# Patient Record
Sex: Female | Born: 1965
Health system: Southern US, Community
[De-identification: ages and names within clinical notes are randomized; demographics above are authoritative.]

## PROBLEM LIST (undated history)

## (undated) DIAGNOSIS — Z87442 Personal history of urinary calculi: Secondary | ICD-10-CM

## (undated) DIAGNOSIS — Z8659 Personal history of other mental and behavioral disorders: Secondary | ICD-10-CM

## (undated) DIAGNOSIS — M1990 Unspecified osteoarthritis, unspecified site: Secondary | ICD-10-CM

## (undated) DIAGNOSIS — F99 Mental disorder, not otherwise specified: Secondary | ICD-10-CM

## (undated) DIAGNOSIS — F419 Anxiety disorder, unspecified: Secondary | ICD-10-CM

## (undated) DIAGNOSIS — K219 Gastro-esophageal reflux disease without esophagitis: Secondary | ICD-10-CM

## (undated) DIAGNOSIS — F909 Attention-deficit hyperactivity disorder, unspecified type: Secondary | ICD-10-CM

## (undated) DIAGNOSIS — R52 Pain, unspecified: Secondary | ICD-10-CM

## (undated) DIAGNOSIS — R4589 Other symptoms and signs involving emotional state: Secondary | ICD-10-CM

## (undated) DIAGNOSIS — R011 Cardiac murmur, unspecified: Secondary | ICD-10-CM

## (undated) DIAGNOSIS — F32A Depression, unspecified: Secondary | ICD-10-CM

## (undated) DIAGNOSIS — Z79899 Other long term (current) drug therapy: Secondary | ICD-10-CM

## (undated) DIAGNOSIS — N189 Chronic kidney disease, unspecified: Secondary | ICD-10-CM

## (undated) DIAGNOSIS — F4310 Post-traumatic stress disorder, unspecified: Secondary | ICD-10-CM

## (undated) DIAGNOSIS — R110 Nausea: Secondary | ICD-10-CM

## (undated) DIAGNOSIS — E8941 Symptomatic postprocedural ovarian failure: Principal | ICD-10-CM

## (undated) DIAGNOSIS — R319 Hematuria, unspecified: Secondary | ICD-10-CM

## (undated) DIAGNOSIS — M542 Cervicalgia: Secondary | ICD-10-CM

## (undated) DIAGNOSIS — E785 Hyperlipidemia, unspecified: Secondary | ICD-10-CM

## (undated) DIAGNOSIS — N898 Other specified noninflammatory disorders of vagina: Principal | ICD-10-CM

## (undated) DIAGNOSIS — R109 Unspecified abdominal pain: Secondary | ICD-10-CM

## (undated) HISTORY — DX: Hyperlipidemia, unspecified: E78.5

## (undated) HISTORY — PX: ABDOMINAL HYSTERECTOMY: SHX81

## (undated) HISTORY — DX: Anxiety disorder, unspecified: F41.9

## (undated) HISTORY — DX: Gastro-esophageal reflux disease without esophagitis: K21.9

## (undated) HISTORY — PX: LUMBAR FUSION: SHX111

## (undated) HISTORY — DX: Symptomatic postprocedural ovarian failure: E89.41

## (undated) HISTORY — DX: Other symptoms and signs involving emotional state: R45.89

## (undated) HISTORY — PX: UPPER GASTROINTESTINAL ENDOSCOPY: SHX188

## (undated) HISTORY — DX: Personal history of other mental and behavioral disorders: Z86.59

## (undated) HISTORY — PX: NECK SURGERY: SHX720

## (undated) HISTORY — DX: Hematuria, unspecified: R31.9

## (undated) HISTORY — PX: WRIST SURGERY: SHX841

## (undated) HISTORY — DX: Other long term (current) drug therapy: Z79.899

## (undated) HISTORY — DX: Pain, unspecified: R52

## (undated) HISTORY — DX: Chronic kidney disease, unspecified: N18.9

## (undated) HISTORY — DX: Cervicalgia: M54.2

## (undated) HISTORY — PX: COLONOSCOPY: SHX174

## (undated) HISTORY — PX: BACK SURGERY: SHX140

## (undated) HISTORY — DX: Nausea: R11.0

## (undated) HISTORY — DX: Mental disorder, not otherwise specified: F99

## (undated) HISTORY — PX: APPENDECTOMY: SHX54

## (undated) HISTORY — DX: Other specified noninflammatory disorders of vagina: N89.8

## (undated) HISTORY — DX: Unspecified abdominal pain: R10.9

## (undated) HISTORY — PX: CARPAL TUNNEL RELEASE: SHX101

## (undated) HISTORY — PX: COLON SURGERY: SHX602

---

## 2000-06-27 ENCOUNTER — Inpatient Hospital Stay (HOSPITAL_COMMUNITY): Admission: EM | Admit: 2000-06-27 | Discharge: 2000-07-20 | Payer: Self-pay | Admitting: *Deleted

## 2001-02-15 ENCOUNTER — Emergency Department (HOSPITAL_COMMUNITY): Admission: EM | Admit: 2001-02-15 | Discharge: 2001-02-15 | Payer: Self-pay | Admitting: Emergency Medicine

## 2001-03-06 ENCOUNTER — Ambulatory Visit (HOSPITAL_COMMUNITY): Admission: RE | Admit: 2001-03-06 | Discharge: 2001-03-06 | Payer: Self-pay | Admitting: Family Medicine

## 2001-03-06 ENCOUNTER — Encounter: Payer: Self-pay | Admitting: Family Medicine

## 2001-03-25 ENCOUNTER — Emergency Department (HOSPITAL_COMMUNITY): Admission: EM | Admit: 2001-03-25 | Discharge: 2001-03-25 | Payer: Self-pay | Admitting: Emergency Medicine

## 2001-04-12 ENCOUNTER — Inpatient Hospital Stay (HOSPITAL_COMMUNITY): Admission: RE | Admit: 2001-04-12 | Discharge: 2001-04-17 | Payer: Self-pay | Admitting: Neurosurgery

## 2001-04-12 ENCOUNTER — Encounter: Payer: Self-pay | Admitting: Neurosurgery

## 2001-05-17 ENCOUNTER — Encounter: Payer: Self-pay | Admitting: Neurosurgery

## 2001-05-17 ENCOUNTER — Encounter: Admission: RE | Admit: 2001-05-17 | Discharge: 2001-05-17 | Payer: Self-pay | Admitting: Neurosurgery

## 2001-09-05 ENCOUNTER — Encounter: Payer: Self-pay | Admitting: General Surgery

## 2001-09-05 ENCOUNTER — Ambulatory Visit (HOSPITAL_COMMUNITY): Admission: RE | Admit: 2001-09-05 | Discharge: 2001-09-05 | Payer: Self-pay | Admitting: General Surgery

## 2001-10-27 ENCOUNTER — Emergency Department (HOSPITAL_COMMUNITY): Admission: EM | Admit: 2001-10-27 | Discharge: 2001-10-27 | Payer: Self-pay

## 2001-10-28 ENCOUNTER — Encounter: Payer: Self-pay | Admitting: Emergency Medicine

## 2001-11-30 ENCOUNTER — Ambulatory Visit (HOSPITAL_COMMUNITY): Admission: RE | Admit: 2001-11-30 | Discharge: 2001-11-30 | Payer: Self-pay | Admitting: Orthopaedic Surgery

## 2002-01-25 ENCOUNTER — Encounter: Payer: Self-pay | Admitting: Internal Medicine

## 2002-01-25 ENCOUNTER — Emergency Department (HOSPITAL_COMMUNITY): Admission: EM | Admit: 2002-01-25 | Discharge: 2002-01-26 | Payer: Self-pay | Admitting: Internal Medicine

## 2002-01-26 ENCOUNTER — Encounter: Payer: Self-pay | Admitting: Internal Medicine

## 2002-07-31 ENCOUNTER — Ambulatory Visit (HOSPITAL_COMMUNITY): Admission: RE | Admit: 2002-07-31 | Discharge: 2002-07-31 | Payer: Self-pay | Admitting: Family Medicine

## 2002-07-31 ENCOUNTER — Encounter: Payer: Self-pay | Admitting: Family Medicine

## 2002-08-20 ENCOUNTER — Ambulatory Visit (HOSPITAL_COMMUNITY): Admission: RE | Admit: 2002-08-20 | Discharge: 2002-08-20 | Payer: Self-pay | Admitting: Urology

## 2002-08-20 ENCOUNTER — Encounter: Payer: Self-pay | Admitting: Urology

## 2002-12-23 ENCOUNTER — Ambulatory Visit (HOSPITAL_COMMUNITY): Admission: RE | Admit: 2002-12-23 | Discharge: 2002-12-23 | Payer: Self-pay | Admitting: Neurosurgery

## 2002-12-23 ENCOUNTER — Encounter: Payer: Self-pay | Admitting: Neurosurgery

## 2003-01-20 ENCOUNTER — Emergency Department (HOSPITAL_COMMUNITY): Admission: EM | Admit: 2003-01-20 | Discharge: 2003-01-20 | Payer: Self-pay | Admitting: Emergency Medicine

## 2003-02-01 ENCOUNTER — Encounter: Payer: Self-pay | Admitting: Family Medicine

## 2003-02-01 ENCOUNTER — Ambulatory Visit (HOSPITAL_COMMUNITY): Admission: RE | Admit: 2003-02-01 | Discharge: 2003-02-01 | Payer: Self-pay | Admitting: Family Medicine

## 2003-03-05 ENCOUNTER — Ambulatory Visit (HOSPITAL_COMMUNITY): Admission: RE | Admit: 2003-03-05 | Discharge: 2003-03-05 | Payer: Self-pay | Admitting: Neurosurgery

## 2003-03-12 ENCOUNTER — Encounter: Payer: Self-pay | Admitting: Neurosurgery

## 2003-03-12 ENCOUNTER — Inpatient Hospital Stay (HOSPITAL_COMMUNITY): Admission: RE | Admit: 2003-03-12 | Discharge: 2003-03-15 | Payer: Self-pay | Admitting: Neurosurgery

## 2003-06-13 ENCOUNTER — Emergency Department (HOSPITAL_COMMUNITY): Admission: EM | Admit: 2003-06-13 | Discharge: 2003-06-13 | Payer: Self-pay | Admitting: *Deleted

## 2003-06-17 ENCOUNTER — Encounter: Admission: RE | Admit: 2003-06-17 | Discharge: 2003-08-28 | Payer: Self-pay | Admitting: Neurosurgery

## 2003-11-03 ENCOUNTER — Inpatient Hospital Stay (HOSPITAL_COMMUNITY): Admission: EM | Admit: 2003-11-03 | Discharge: 2003-11-04 | Payer: Self-pay | Admitting: Emergency Medicine

## 2004-01-07 ENCOUNTER — Encounter: Admission: RE | Admit: 2004-01-07 | Discharge: 2004-03-11 | Payer: Self-pay | Admitting: Orthopedic Surgery

## 2004-05-13 ENCOUNTER — Ambulatory Visit (HOSPITAL_COMMUNITY): Admission: RE | Admit: 2004-05-13 | Discharge: 2004-05-13 | Payer: Self-pay | Admitting: Orthopedic Surgery

## 2004-06-10 ENCOUNTER — Ambulatory Visit: Payer: Self-pay | Admitting: Orthopedic Surgery

## 2004-06-15 ENCOUNTER — Ambulatory Visit (HOSPITAL_COMMUNITY): Admission: RE | Admit: 2004-06-15 | Discharge: 2004-06-15 | Payer: Self-pay | Admitting: Family Medicine

## 2004-07-28 ENCOUNTER — Ambulatory Visit: Payer: Self-pay | Admitting: Family Medicine

## 2004-10-29 ENCOUNTER — Ambulatory Visit: Payer: Self-pay | Admitting: Family Medicine

## 2004-11-17 ENCOUNTER — Ambulatory Visit: Payer: Self-pay | Admitting: Psychology

## 2004-12-09 ENCOUNTER — Encounter: Admission: RE | Admit: 2004-12-09 | Discharge: 2004-12-09 | Payer: Self-pay | Admitting: Neurosurgery

## 2004-12-15 ENCOUNTER — Emergency Department (HOSPITAL_COMMUNITY): Admission: EM | Admit: 2004-12-15 | Discharge: 2004-12-15 | Payer: Self-pay | Admitting: Emergency Medicine

## 2004-12-21 ENCOUNTER — Ambulatory Visit: Payer: Self-pay | Admitting: Psychology

## 2004-12-21 ENCOUNTER — Ambulatory Visit (HOSPITAL_COMMUNITY): Admission: RE | Admit: 2004-12-21 | Discharge: 2004-12-21 | Payer: Self-pay | Admitting: Neurosurgery

## 2004-12-30 ENCOUNTER — Ambulatory Visit: Payer: Self-pay | Admitting: Family Medicine

## 2005-01-05 ENCOUNTER — Ambulatory Visit (HOSPITAL_COMMUNITY): Admission: RE | Admit: 2005-01-05 | Discharge: 2005-01-05 | Payer: Self-pay | Admitting: Family Medicine

## 2005-01-06 ENCOUNTER — Ambulatory Visit: Payer: Self-pay | Admitting: *Deleted

## 2005-01-07 ENCOUNTER — Encounter (HOSPITAL_COMMUNITY): Admission: RE | Admit: 2005-01-07 | Discharge: 2005-02-06 | Payer: Self-pay | Admitting: Oncology

## 2005-01-11 ENCOUNTER — Ambulatory Visit: Payer: Self-pay | Admitting: Cardiology

## 2005-01-11 ENCOUNTER — Encounter (HOSPITAL_COMMUNITY): Admission: RE | Admit: 2005-01-11 | Discharge: 2005-02-10 | Payer: Self-pay | Admitting: *Deleted

## 2005-02-11 ENCOUNTER — Ambulatory Visit: Payer: Self-pay | Admitting: Internal Medicine

## 2005-03-02 ENCOUNTER — Ambulatory Visit (HOSPITAL_COMMUNITY): Admission: RE | Admit: 2005-03-02 | Discharge: 2005-03-02 | Payer: Self-pay | Admitting: Internal Medicine

## 2005-03-02 ENCOUNTER — Ambulatory Visit: Payer: Self-pay | Admitting: Internal Medicine

## 2005-03-23 ENCOUNTER — Ambulatory Visit: Payer: Self-pay | Admitting: Family Medicine

## 2005-10-12 ENCOUNTER — Ambulatory Visit (HOSPITAL_COMMUNITY): Admission: RE | Admit: 2005-10-12 | Discharge: 2005-10-12 | Payer: Self-pay | Admitting: Orthopaedic Surgery

## 2005-11-12 ENCOUNTER — Emergency Department (HOSPITAL_COMMUNITY): Admission: EM | Admit: 2005-11-12 | Discharge: 2005-11-13 | Payer: Self-pay | Admitting: Emergency Medicine

## 2005-11-30 ENCOUNTER — Ambulatory Visit (HOSPITAL_COMMUNITY): Admission: RE | Admit: 2005-11-30 | Discharge: 2005-11-30 | Payer: Self-pay | Admitting: Neurosurgery

## 2005-12-14 IMAGING — NM NM MYOCAR PERF EJECTION FRACTION
1 series · 6 of 6 positions shown · non-contrast
Comparison: none

CLINICAL DATA: 38-year-old woman with no known coronary artery disease but multiple cardiovascular risk factors, presenting with chest discomfort.  
 ADENOSINE STRESS MYOVIEW STUDY:
 RADIONUCLIDE DATA:  One day rest/stress protocol performed with [DATE] mCi Jc-44m Myoview.
 STRESS DATA:  Treadmill exercise initially attempted.  The patient described arm pain, neck pain, and mild chest tightness and stopped with fatigue.  Due to inadequate heart rate, Adenosine was infused, resulting in chest tightness, dyspnea, lightheadedness, and abdominal discomfort.  Intermittent second degree AV block occurred.  There was a moderate increase in heart rate but no change in systolic blood pressure with drug administration.  
 EKG:  Sinus bradycardia; non-diagnostic inferior Q waves; otherwise, within normal limits.  No significant change with low level exercise nor Adenosine.  
 SCINTIGRAPHIC DATA:  Acquisition notable for mild to moderate breast attenuation.  There was fairly intense GI activity adjacent to the inferior wall during the resting portion of the study.  Left ventricular size was at the upper limits of normal.  On tomographic images reconstructed in standard plane, there was a small to moderate segment of the anterior septal region extending to the apex, with very mildly decreased tracer uptake.  The rest images were unchanged.  The gated reconstruction demonstrated normal regional and global LV systolic function as well as normal systolic accentuation of activity throughout.  Estimated ejection fraction was .63.

[Series 1: cs cardiac tc hi dose · 6.52mm/px · 6 of 512 frames shown]
[frame 43/512]
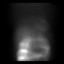
[frame 128/512]
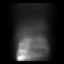
[frame 214/512]
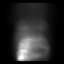
[frame 299/512]
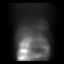
[frame 384/512]
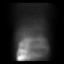
[frame 470/512]
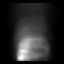

[6 of 6 positions shown; findings below may reference images not displayed]

IMPRESSION: Probably negative pharmacologic stress Myoview study, revealing no significant stress-induced EKG abnormalities, upper normal left ventricular size and normal left ventricular systolic function.  By scintigraphic imaging, there was breast attenuation artifact but no convincing evidence for myocardial ischemia or infarction.  A small degree of scarring in the septum and apex cannot be unequivocally excluded.

## 2006-08-01 ENCOUNTER — Ambulatory Visit (HOSPITAL_COMMUNITY): Admission: RE | Admit: 2006-08-01 | Discharge: 2006-08-01 | Payer: Self-pay | Admitting: Orthopedic Surgery

## 2006-09-05 ENCOUNTER — Encounter: Admission: RE | Admit: 2006-09-05 | Discharge: 2006-09-05 | Payer: Self-pay | Admitting: Orthopedic Surgery

## 2006-09-08 ENCOUNTER — Encounter: Admission: RE | Admit: 2006-09-08 | Discharge: 2006-09-08 | Payer: Self-pay | Admitting: Orthopedic Surgery

## 2006-09-30 ENCOUNTER — Inpatient Hospital Stay (HOSPITAL_COMMUNITY): Admission: RE | Admit: 2006-09-30 | Discharge: 2006-10-04 | Payer: Self-pay | Admitting: Orthopedic Surgery

## 2007-06-22 ENCOUNTER — Ambulatory Visit (HOSPITAL_COMMUNITY): Admission: RE | Admit: 2007-06-22 | Discharge: 2007-06-22 | Payer: Self-pay | Admitting: Family Medicine

## 2008-01-25 ENCOUNTER — Encounter: Admission: RE | Admit: 2008-01-25 | Discharge: 2008-01-25 | Payer: Self-pay | Admitting: Orthopedic Surgery

## 2008-03-23 ENCOUNTER — Emergency Department (HOSPITAL_COMMUNITY): Admission: EM | Admit: 2008-03-23 | Discharge: 2008-03-23 | Payer: Self-pay | Admitting: Emergency Medicine

## 2008-08-15 ENCOUNTER — Emergency Department (HOSPITAL_COMMUNITY): Admission: EM | Admit: 2008-08-15 | Discharge: 2008-08-15 | Payer: Self-pay | Admitting: Emergency Medicine

## 2008-10-17 ENCOUNTER — Encounter: Admission: RE | Admit: 2008-10-17 | Discharge: 2008-10-17 | Payer: Self-pay | Admitting: Orthopedic Surgery

## 2008-11-05 ENCOUNTER — Inpatient Hospital Stay (HOSPITAL_COMMUNITY): Admission: RE | Admit: 2008-11-05 | Discharge: 2008-11-08 | Payer: Self-pay | Admitting: Orthopedic Surgery

## 2009-05-02 ENCOUNTER — Encounter: Admission: RE | Admit: 2009-05-02 | Discharge: 2009-05-02 | Payer: Self-pay | Admitting: Orthopedic Surgery

## 2009-11-23 ENCOUNTER — Emergency Department (HOSPITAL_COMMUNITY): Admission: EM | Admit: 2009-11-23 | Discharge: 2009-11-23 | Payer: Self-pay | Admitting: Emergency Medicine

## 2009-11-25 ENCOUNTER — Emergency Department (HOSPITAL_COMMUNITY): Admission: EM | Admit: 2009-11-25 | Discharge: 2009-11-25 | Payer: Self-pay | Admitting: Emergency Medicine

## 2009-12-04 ENCOUNTER — Emergency Department (HOSPITAL_COMMUNITY): Admission: EM | Admit: 2009-12-04 | Discharge: 2009-12-04 | Payer: Self-pay | Admitting: Emergency Medicine

## 2009-12-10 ENCOUNTER — Ambulatory Visit (HOSPITAL_COMMUNITY): Admission: RE | Admit: 2009-12-10 | Discharge: 2009-12-10 | Payer: Self-pay | Admitting: Cardiology

## 2010-07-07 ENCOUNTER — Encounter (INDEPENDENT_AMBULATORY_CARE_PROVIDER_SITE_OTHER): Payer: Self-pay | Admitting: Orthopedic Surgery

## 2010-07-07 ENCOUNTER — Inpatient Hospital Stay (HOSPITAL_COMMUNITY): Admission: RE | Admit: 2010-07-07 | Discharge: 2010-07-09 | Payer: Self-pay | Admitting: Orthopedic Surgery

## 2010-08-30 ENCOUNTER — Encounter: Payer: Self-pay | Admitting: Neurosurgery

## 2010-08-30 ENCOUNTER — Encounter: Payer: Self-pay | Admitting: Family Medicine

## 2010-08-30 ENCOUNTER — Encounter: Payer: Self-pay | Admitting: Emergency Medicine

## 2010-08-31 ENCOUNTER — Encounter: Payer: Self-pay | Admitting: Orthopedic Surgery

## 2010-10-20 LAB — BASIC METABOLIC PANEL
Chloride: 102 mEq/L (ref 96–112)
Chloride: 103 mEq/L (ref 96–112)
Creatinine, Ser: 0.61 mg/dL (ref 0.4–1.2)
Creatinine, Ser: 0.66 mg/dL (ref 0.4–1.2)
Glucose, Bld: 109 mg/dL — ABNORMAL HIGH (ref 70–99)

## 2010-10-20 LAB — SURGICAL PCR SCREEN: MRSA, PCR: NEGATIVE

## 2010-10-20 LAB — COMPREHENSIVE METABOLIC PANEL
Albumin: 4.1 g/dL (ref 3.5–5.2)
BUN: 8 mg/dL (ref 6–23)
CO2: 29 mEq/L (ref 19–32)
Chloride: 103 mEq/L (ref 96–112)
GFR calc Af Amer: >60 mL/min (ref >60)
GFR calc non Af Amer: >60 mL/min (ref >60)
Total Bilirubin: 0.6 mg/dL (ref 0.3–1.2)
Total Protein: 6.7 g/dL (ref 6.0–8.3)

## 2010-10-20 LAB — TYPE AND SCREEN: ABO/RH(D): B NEG

## 2010-10-20 LAB — URINALYSIS, ROUTINE W REFLEX MICROSCOPIC
Glucose, UA: NEGATIVE mg/dL
Hgb urine dipstick: NEGATIVE
Ketones, ur: NEGATIVE mg/dL
Nitrite: NEGATIVE
Specific Gravity, Urine: 1.02 (ref 1.005–1.030)

## 2010-10-20 LAB — CBC
HCT: 33.1 % — ABNORMAL LOW (ref 36.0–46.0)
HCT: 34.5 % — ABNORMAL LOW (ref 36.0–46.0)
Hemoglobin: 14.4 g/dL (ref 12.0–15.0)
MCH: 31.2 pg (ref 26.0–34.0)
MCH: 31.3 pg (ref 26.0–34.0)
MCV: 93.1 fL (ref 78.0–100.0)
MCV: 94.8 fL (ref 78.0–100.0)
Platelets: 184 10*3/uL (ref 150–400)
RBC: 3.49 MIL/uL — ABNORMAL LOW (ref 3.87–5.11)
RDW: 12.4 % (ref 11.5–15.5)
RDW: 12.8 % (ref 11.5–15.5)
RDW: 12.8 % (ref 11.5–15.5)
WBC: 7.7 10*3/uL (ref 4.0–10.5)

## 2010-10-20 LAB — DIFFERENTIAL
Basophils Relative: 0 % (ref 0–1)
Eosinophils Relative: 2 % (ref 0–5)
Monocytes Absolute: 0.3 10*3/uL (ref 0.1–1.0)
Monocytes Relative: 4 % (ref 3–12)
Neutro Abs: 3.7 10*3/uL (ref 1.7–7.7)
Neutrophils Relative %: 48 % (ref 43–77)

## 2010-10-20 LAB — LIPID PANEL
Cholesterol: 253 mg/dL — ABNORMAL HIGH (ref 0–200)
HDL: 52 mg/dL (ref >39)
VLDL: 30 mg/dL (ref 0–40)

## 2010-10-20 LAB — VITAMIN D 25 HYDROXY (VIT D DEFICIENCY, FRACTURES)
Vit D, 25-Hydroxy: 27 ng/mL — ABNORMAL LOW (ref 30–89)
Vit D, 25-Hydroxy: 29 ng/mL — ABNORMAL LOW (ref 30–89)

## 2010-10-20 LAB — URINE MICROSCOPIC-ADD ON

## 2010-10-27 LAB — COMPREHENSIVE METABOLIC PANEL
Albumin: 3.9 g/dL (ref 3.5–5.2)
Alkaline Phosphatase: 82 U/L (ref 39–117)
BUN: 9 mg/dL (ref 6–23)
Creatinine, Ser: 0.64 mg/dL (ref 0.4–1.2)
Glucose, Bld: 120 mg/dL — ABNORMAL HIGH (ref 70–99)
Potassium: 3.8 mEq/L (ref 3.5–5.1)
Total Bilirubin: 0.6 mg/dL (ref 0.3–1.2)
Total Protein: 6.5 g/dL (ref 6.0–8.3)

## 2010-10-27 LAB — CBC
HCT: 38.9 % (ref 36.0–46.0)
HCT: 39.8 % (ref 36.0–46.0)
Hemoglobin: 13.9 g/dL (ref 12.0–15.0)
Hemoglobin: 13.9 g/dL (ref 12.0–15.0)
MCHC: 35 g/dL (ref 30.0–36.0)
MCV: 94.4 fL (ref 78.0–100.0)
Platelets: 177 10*3/uL (ref 150–400)
RBC: 4.17 MIL/uL (ref 3.87–5.11)
RDW: 12.9 % (ref 11.5–15.5)

## 2010-10-27 LAB — DIFFERENTIAL
Basophils Absolute: 0 10*3/uL (ref 0.0–0.1)
Basophils Relative: 0 % (ref 0–1)
Eosinophils Relative: 3 % (ref 0–5)
Lymphocytes Relative: 33 % (ref 12–46)
Lymphocytes Relative: 38 % (ref 12–46)
Lymphs Abs: 2.2 10*3/uL (ref 0.7–4.0)
Monocytes Absolute: 0.4 10*3/uL (ref 0.1–1.0)
Monocytes Relative: 5 % (ref 3–12)
Monocytes Relative: 8 % (ref 3–12)
Neutro Abs: 2.9 10*3/uL (ref 1.7–7.7)
Neutro Abs: 5.3 10*3/uL (ref 1.7–7.7)
Neutrophils Relative %: 59 % (ref 43–77)

## 2010-10-27 LAB — POCT CARDIAC MARKERS
CKMB, poc: <1 ng/mL — ABNORMAL LOW (ref 1.0–8.0)
CKMB, poc: <1 ng/mL — ABNORMAL LOW (ref 1.0–8.0)
Myoglobin, poc: 32.3 ng/mL (ref 12–200)
Troponin i, poc: <0.05 ng/mL (ref 0.00–0.09)

## 2010-10-27 LAB — BASIC METABOLIC PANEL
Calcium: 9.2 mg/dL (ref 8.4–10.5)
GFR calc Af Amer: >60 mL/min (ref >60)
GFR calc non Af Amer: >60 mL/min (ref >60)
Potassium: 3.8 mEq/L (ref 3.5–5.1)
Sodium: 138 mEq/L (ref 135–145)

## 2010-11-18 LAB — CBC
HCT: 33.6 % — ABNORMAL LOW (ref 36.0–46.0)
Platelets: 106 10*3/uL — ABNORMAL LOW (ref 150–400)
RDW: 12.3 % (ref 11.5–15.5)

## 2010-11-18 LAB — BASIC METABOLIC PANEL
BUN: 4 mg/dL — ABNORMAL LOW (ref 6–23)
Calcium: 8 mg/dL — ABNORMAL LOW (ref 8.4–10.5)
Creatinine, Ser: 0.64 mg/dL (ref 0.4–1.2)
GFR calc non Af Amer: >60 mL/min (ref >60)
Glucose, Bld: 134 mg/dL — ABNORMAL HIGH (ref 70–99)
Potassium: 3.1 mEq/L — ABNORMAL LOW (ref 3.5–5.1)

## 2010-11-19 LAB — COMPREHENSIVE METABOLIC PANEL
ALT: 23 U/L (ref 0–35)
Albumin: 4.2 g/dL (ref 3.5–5.2)
Alkaline Phosphatase: 63 U/L (ref 39–117)
Glucose, Bld: 85 mg/dL (ref 70–99)
Potassium: 3.8 mEq/L (ref 3.5–5.1)
Sodium: 135 mEq/L (ref 135–145)
Total Protein: 6.8 g/dL (ref 6.0–8.3)

## 2010-11-19 LAB — CBC
Hemoglobin: 15 g/dL (ref 12.0–15.0)
MCHC: 34.5 g/dL (ref 30.0–36.0)
Platelets: 114 10*3/uL — ABNORMAL LOW (ref 150–400)
RBC: 3.78 MIL/uL — ABNORMAL LOW (ref 3.87–5.11)
RDW: 12.4 % (ref 11.5–15.5)
WBC: 10 10*3/uL (ref 4.0–10.5)
WBC: 7.8 10*3/uL (ref 4.0–10.5)

## 2010-11-19 LAB — DIFFERENTIAL
Basophils Relative: 1 % (ref 0–1)
Eosinophils Absolute: 0.2 10*3/uL (ref 0.0–0.7)
Monocytes Absolute: 0.5 10*3/uL (ref 0.1–1.0)
Monocytes Relative: 6 % (ref 3–12)

## 2010-11-19 LAB — URINALYSIS, ROUTINE W REFLEX MICROSCOPIC
Glucose, UA: NEGATIVE mg/dL
Hgb urine dipstick: NEGATIVE
Specific Gravity, Urine: 1.024 (ref 1.005–1.030)

## 2010-11-19 LAB — TYPE AND SCREEN
ABO/RH(D): B NEG
Antibody Screen: NEGATIVE

## 2010-11-19 LAB — BASIC METABOLIC PANEL
CO2: 30 mEq/L (ref 19–32)
Calcium: 8.4 mg/dL (ref 8.4–10.5)
Creatinine, Ser: 0.6 mg/dL (ref 0.4–1.2)
GFR calc Af Amer: >60 mL/min (ref >60)

## 2010-11-19 LAB — ABO/RH: ABO/RH(D): B NEG

## 2010-12-22 NOTE — Op Note (Signed)
Martha [name], Martha [name]                ACCOUNT NO.:  [id number]   MEDICAL RECORD NO.:  [id number]          PATIENT TYPE:  INP   LOCATION:  5006                         FACILITY:  [facility]   PHYSICIAN:  Nelda Severe, MD      DATE OF BIRTH:  13-Feb-1966   DATE OF PROCEDURE:  11/05/2008  DATE OF DISCHARGE:                               OPERATIVE REPORT   SURGEON:  Nelda Severe, MD   ASSISTANT:  Lianne Cure, PA-C   PREOPERATIVE DIAGNOSIS:  Pseudoarthrosis L5-S1 status post posterior  fusion with pedicle screws and autogenous bone graft and interbody cage.   POSTOPERATIVE DIAGNOSIS:  Pseudoarthrosis L5-S1 status post posterior  fusion with pedicle screws and autogenous bone graft and interbody cage.   OPERATIVE PROCEDURES:  1. Bilateral posterolateral fusion L5-S1.  2. Reinsertion pedicle screws.  3. Aspiration bone marrow left posterior iliac crest.  4. Admixture local bone graft with bone marrow aspirate and InQu,      preparation of local bone graft with small pack bone morphogenic      protein (infuse).   OPERATIVE FINDINGS:  All of the pedicle screws were somewhat loose.  The  pedicle screw at right L5 was grossly loose.  There was no evidence of  bony fusion.  There was motion between L5 and S1.   OPERATIVE NOTE:  The patient was placed under general endotracheal  anesthesia.  A gram of vancomycin was administered intravenously for  prophylaxis.  Sequential compression devices were placed on both lower  extremities.  The patient was positioned prone on a Jackson frame.  Care  was taken to position the upper extremities so as to avoid hyperflexion  and abduction of the shoulders and so as to avoid hyperflexion of the  elbows.  The hips and knees were gently flexed.  The thighs, knees,  shins and ankles were supported on pillows.   The prior midline incision was outlined with a skin marker.  The skin  was prepped with DuraPrep and the lumbar area draped in rectangular  fashion.  The drapes were secured with Ioban.   A time-out was held at which point the patient's identity, the  preoperative diagnosis, and the intended procedure were all confirmed as  well as the other points in the time-out.   An incision was made into the dermis of the skin in elliptical fashion  around the prior incision.  The subcutaneous tissue was injected with a  mixture of 0.25% plain Marcaine and 1% lidocaine with epinephrine.  The  elliptical scar excision was then completed.  Dissection was carried  through the subcutaneous layer to the tips of the distal spinous  processes at L4, L5 and S1.  Paraspinal muscle and scar was mobilized  bilaterally.  Screws on the left then the right were identified and  exposed.  Couplings were unfastened and the rods removed.  Then each  screw was removed.  As noted above, all screws were loose, the most  loose being L5 on the right side.   I then exposed the posterolateral area between the ala of the sacrum and  the transverse process on the left and then on the right.   I then harvested local bone graft from the lamina of L5 on the right as  well as from the area of the inferior articular process of L5 on the  right.  This bone was morselized.   A small package of infuse was obtained and the collagen-sponge soaked  with the solution.  We then made small burrito like composites of infuse  in bone graft.  I further decorticated the ala of the sacrum on the  right side and the transverse process of L5 on the right side.  The  collagen-soaked sponge and bone graft was then inserted between the  decorticated ala of the sacrum and the decorticated transverse process.   Next, a 7.2-mm diameter screw was placed at S1 and an 8.0-mm diameter  screw was placed at L5, both achieved good purchase.  Each screw was  then stimulated and distal EMG activity recorded.  At both levels, the  level of current required to stimulate distal EMG activity was  in a safe  zone.  A pre-contoured rod was then attached and provisionally coupled.   We then did exactly the same thing on the right side, decorticating the  ala of the sacrum and the transverse process at L5 and making sure that  all scar tissue was excised so the surrounding tissues were vascular.  Another burrito was made from collagen sponge and bone graft and  placed posterolaterally between the transverse process of L5 and the ala  of the sacrum.  On the right side, 8.0 mm diameter screws were used at  both S1 and L5.  They were then stimulated.  Again, the results of  stimulation and distal EMG activity indicated that we were in a safe  zone, meaning the likelihood of contact between metal and nerve root is  exceptionally low.  We then attached a pre-contoured rod and  provisionally coupled it.   Actually, we took a cross-table lateral radiograph before attaching the  second rod.  Screws were in satisfactory condition.   I then decorticated the sacral lamina on the right side and further  decorticated the L5 lamina on the right side.  The remaining graft was  morselized and mixed with 10 mL InQu (hyaluronic acid) and bone marrow  aspirate which was aspirated from the left iliac crest via an 18-gauge  spinal needle.   This slurry was then packed in on the right side posteriorly in the area  where I had resected the joint surfaces of the facet joint of L5-S1 and  between the lamina of L5 and S1.  A piece of Gelfoam was placed over the  slurry to prevent it migrating throughout the wound.   We then closed the thoracolumbar fascia with continuous #1 Vicryl  suture.  The subcutaneous layer was closed over a one-eighth inch  Hemovac drain using interrupted 2-0 inverted Vicryl sutures.  The drain  was secured with a 2-0 nylon.  The skin was closed using a subcuticular  continuous 3-0 undyed Vicryl.  The skin edges were reinforced with Steri-  Strips.  Antibiotic ointment and  dressing was applied and secured with  OpSite.  Blood loss estimated at less than 200 mL.  The patient was  taken off the table, placed in bed, and transferred to the recovery room  where she awakened and was able to move all limbs.   There were no intraoperative complications.     Casimiro Needle  Alveda Reasons, MD  Electronically Signed    MT/MEDQ  D:  11/05/2008  T:  11/06/2008  Job:  [id number]

## 2010-12-25 NOTE — Discharge Summary (Signed)
[name], [name]                ACCOUNT NO.:  [id number]   MEDICAL RECORD NO.:  [id number]          PATIENT TYPE:  INP   LOCATION:  5006                         FACILITY:  [facility]   PHYSICIAN:  Nelda Severe, MD      DATE OF BIRTH:  1966/03/28   DATE OF ADMISSION:  11/05/2008  DATE OF DISCHARGE:  11/08/2008                               DISCHARGE SUMMARY   FINAL DIAGNOSIS:  Status post L5-S1 fusion with pseudoarthrosis.   The patient was admitted for management of painful back associated with  L5-S1 pseudoarthrosis, following a lumbosacral fusion almost 2 years  ago.  She was taken to the operating room the day of admission where the  previously implanted screws and rods were removed, she was rebone  grafted and new screws and rods placed.  The previously placed screws  were virtually all loose.   Postoperatively, her course was uncomplicated by any serious problems.  She did have persistent nausea and headache of no serious cause.  She  was able to ambulate with a walker and made satisfactory progress.  She  was discharged from the hospital on November 08, 2008, able to ambulate with  a walker, wound stable, controlling pain with oral analgesics, able to  void, and able to tolerate a diet.   She will be followed in the office in approximately 3-4 weeks' time.  She has been instructed to be in touch with me if there is any problem  with wound drainage or fever.  She is to avoid bending and lifting.   She was discharged to the care of her fiancee.  She has been previously  prescribed Demerol orally, which is the pain medicine that she can  tolerate.  She has this at home.      Nelda Severe, MD  Electronically Signed     MT/MEDQ  D:  11/09/2008  T:  11/10/2008  Job:  [id number]

## 2011-06-04 ENCOUNTER — Other Ambulatory Visit: Payer: Self-pay

## 2011-06-04 DIAGNOSIS — M7989 Other specified soft tissue disorders: Secondary | ICD-10-CM

## 2011-06-04 DIAGNOSIS — M79609 Pain in unspecified limb: Secondary | ICD-10-CM

## 2011-06-24 ENCOUNTER — Encounter: Payer: Self-pay | Admitting: Vascular Surgery

## 2011-07-01 ENCOUNTER — Emergency Department (HOSPITAL_COMMUNITY)
Admission: EM | Admit: 2011-07-01 | Discharge: 2011-07-02 | Disposition: A | Discharge status: Home / Self Care | Payer: Medicare Other | Attending: Emergency Medicine | Admitting: Emergency Medicine

## 2011-07-01 ENCOUNTER — Encounter (HOSPITAL_COMMUNITY): Payer: Self-pay | Admitting: Emergency Medicine

## 2011-07-01 ENCOUNTER — Emergency Department (HOSPITAL_COMMUNITY): Payer: Medicare Other

## 2011-07-01 ENCOUNTER — Other Ambulatory Visit: Payer: Self-pay

## 2011-07-01 DIAGNOSIS — R062 Wheezing: Secondary | ICD-10-CM | POA: Insufficient documentation

## 2011-07-01 DIAGNOSIS — IMO0001 Reserved for inherently not codable concepts without codable children: Secondary | ICD-10-CM | POA: Insufficient documentation

## 2011-07-01 DIAGNOSIS — J111 Influenza due to unidentified influenza virus with other respiratory manifestations: Secondary | ICD-10-CM | POA: Insufficient documentation

## 2011-07-01 DIAGNOSIS — R079 Chest pain, unspecified: Secondary | ICD-10-CM | POA: Insufficient documentation

## 2011-07-01 DIAGNOSIS — R509 Fever, unspecified: Secondary | ICD-10-CM | POA: Insufficient documentation

## 2011-07-01 DIAGNOSIS — R000 Tachycardia, unspecified: Secondary | ICD-10-CM | POA: Insufficient documentation

## 2011-07-01 DIAGNOSIS — I10 Essential (primary) hypertension: Secondary | ICD-10-CM | POA: Insufficient documentation

## 2011-07-01 DIAGNOSIS — E785 Hyperlipidemia, unspecified: Secondary | ICD-10-CM | POA: Insufficient documentation

## 2011-07-01 MED ORDER — SODIUM CHLORIDE 0.9 % IV BOLUS (SEPSIS)
1000.0000 mL | INTRAVENOUS | Status: AC
  Administered 2011-07-02: 1000 mL via INTRAVENOUS

## 2011-07-01 MED ORDER — ACETAMINOPHEN 500 MG PO TABS
1000.0000 mg | ORAL_TABLET | Freq: Once | ORAL | Status: AC
  Administered 2011-07-02: 1000 mg via ORAL
  Filled 2011-07-01: qty 2

## 2011-07-01 MED ORDER — ONDANSETRON HCL 4 MG/2ML IJ SOLN
4.0000 mg | Freq: Once | INTRAMUSCULAR | Status: AC
  Administered 2011-07-02: 4 mg via INTRAVENOUS
  Filled 2011-07-01: qty 2

## 2011-07-01 MED ORDER — BENZONATATE 100 MG PO CAPS
200.0000 mg | ORAL_CAPSULE | Freq: Once | ORAL | Status: AC
  Administered 2011-07-02: 200 mg via ORAL
  Filled 2011-07-01: qty 2

## 2011-07-01 MED ORDER — KETOROLAC TROMETHAMINE 30 MG/ML IJ SOLN
30.0000 mg | Freq: Once | INTRAMUSCULAR | Status: AC
  Administered 2011-07-02: 30 mg via INTRAVENOUS
  Filled 2011-07-01: qty 1

## 2011-07-01 NOTE — ED Provider Notes (Addendum)
History     CSN: [id number] Arrival date & time: 07/01/2011 10:47 PM   First MD Initiated Contact with Patient 07/01/11 2325      Chief Complaint  Patient presents with  . Chest Pain  . Fever    (Consider location/radiation/quality/duration/timing/severity/associated sxs/prior treatment) HPI Comments: 45 year old female with a history of back pain, surgery, hypercholesterolemia presents with fevers chills myalgias nausea and vomiting. This is associated with a cough. Symptoms have been going on for 3 days, persistent, nothing makes better or worse. She denies swelling or rashes in burning with urination. She denies having any sick contacts and denies having a flu shot.  Patient is a 45 y.o. female presenting with chest pain and fever. The history is provided by the patient and medical records.  Chest Pain Primary symptoms include a fever.    Fever Primary symptoms of the febrile illness include fever.    Past Medical History  Diagnosis Date  . Hyperlipidemia   . Neck pain   . History of depression   . GERD (gastroesophageal reflux disease)   . Hypertension     Past Surgical History  Procedure Date  . Abdominal hysterectomy   . Colon surgery   . Lumbar fusion     L5-S1  . Appendectomy   . Wrist surgery     cyst removal    Family History  Problem Relation Age of Onset  . Cancer Other     History  Substance Use Topics  . Smoking status: Current Everyday Smoker -- 1.0 packs/day    Types: Cigarettes  . Smokeless tobacco: Not on file  . Alcohol Use: No    OB History    Grav Para Term Preterm Abortions TAB SAB Ect Mult Living                  Review of Systems  Constitutional: Positive for fever.  Cardiovascular: Positive for chest pain.  All other systems reviewed and are negative.    Allergies  Codeine and Morphine and related  Home Medications   Current Outpatient Rx  Name Route Sig Dispense Refill  . CARISOPRODOL 350 MG PO TABS Oral Take  350 mg by mouth at bedtime.      Marland Kitchen CLONAZEPAM 1 MG PO TABS Oral Take 1-2 mg by mouth every 8 (eight) hours.      Marland Kitchen ROSUVASTATIN CALCIUM 20 MG PO TABS Oral Take 20 mg by mouth at bedtime.      Marland Kitchen VITAMIN D (ERGOCALCIFEROL) 50000 UNITS PO CAPS Oral Take 50,000 Units by mouth every 30 (thirty) days.      . ALBUTEROL SULFATE HFA 108 (90 BASE) MCG/ACT IN AERS Inhalation Inhale 2 puffs into the lungs every 4 (four) hours as needed for wheezing or shortness of breath. 1 Inhaler 3  . BENZONATATE 200 MG PO CAPS Oral Take 1 capsule (200 mg total) by mouth 3 (three) times daily as needed for cough. 20 capsule 0  . CYCLOBENZAPRINE HCL 10 MG PO TABS Oral Take 10 mg by mouth 3 (three) times daily as needed.      Marland Kitchen DIPHENHYDRAMINE HCL 50 MG PO CAPS Oral Take 50 mg by mouth at bedtime as needed. For sleep     . DOCUSATE SODIUM 100 MG PO CAPS Oral Take 100 mg by mouth daily.      Marland Kitchen HYDROCODONE-ACETAMINOPHEN 5-500 MG PO TABS Oral Take 1-2 tablets by mouth every 6 (six) hours as needed for pain. 15 tablet 0  . METHOCARBAMOL 500  MG PO TABS Oral Take 500-1,000 mg by mouth 3 (three) times daily.      Marland Kitchen NAPROXEN 500 MG PO TABS Oral Take 1 tablet (500 mg total) by mouth 2 (two) times daily with a meal. 30 tablet 0  . ONDANSETRON 4 MG PO TBDP Oral Take 1 tablet (4 mg total) by mouth every 8 (eight) hours as needed for nausea. 10 tablet 0  . OSELTAMIVIR PHOSPHATE 75 MG PO CAPS Oral Take 1 capsule (75 mg total) by mouth every 12 (twelve) hours. 10 capsule 0  . PROMETHAZINE HCL 12.5 MG PO TABS Oral Take 12.5-25 mg by mouth every 4 (four) hours as needed. For nausea       BP 98/55  Pulse 100  Temp(Src) 99.2 F (37.3 C) (Oral)  Resp 15  Ht 5\' 5"  (1.651 m)  Wt 140 lb (63.504 kg)  BMI 23.30 kg/m2  SpO2 92%  Physical Exam  Nursing note and vitals reviewed. Constitutional: She appears well-developed and well-nourished.       Uncomfortable appearing  HENT:  Head: Normocephalic and atraumatic.  Mouth/Throat: Oropharynx  is clear and moist. No oropharyngeal exudate.  Eyes: Conjunctivae and EOM are normal. Pupils are equal, round, and reactive to light. Right eye exhibits no discharge. Left eye exhibits no discharge. No scleral icterus.  Neck: Normal range of motion. Neck supple. No JVD present. No thyromegaly present.  Cardiovascular: Regular rhythm, normal heart sounds and intact distal pulses.  Exam reveals no gallop and no friction rub.   No murmur heard.      Tachycardia  Pulmonary/Chest: Effort normal and breath sounds normal. No respiratory distress. She has no wheezes. She has no rales.  Abdominal: Soft. Bowel sounds are normal. She exhibits no distension and no mass. There is no tenderness.  Musculoskeletal: Normal range of motion. She exhibits no edema and no tenderness.  Lymphadenopathy:    She has no cervical adenopathy.  Neurological: She is alert. Coordination normal.  Skin: Skin is warm and dry. No rash noted. No erythema.  Psychiatric: She has a normal mood and affect. Her behavior is normal.    ED Course  Procedures (including critical care time)  Labs Reviewed  BASIC METABOLIC PANEL - Abnormal; Notable for the following:    Potassium 3.4 (*)    All other components within normal limits   Dg Chest 2 View  07/02/2011  *RADIOLOGY REPORT*  Clinical Data: Fever.  Chest congestion.  Swallowed the.  Chest pain.  CHEST - 2 VIEW 07/02/2011:  Comparison: Portable chest x-ray 12/04/2009 and two-view chest x- ray 11/23/2009 [facility], two-view chest x-ray 10/12/2008 and 11/12/2005 [facility].  Findings: Cardiac silhouette upper normal in size, unchanged. Prominent bronchovascular markings diffusely and moderate to marked central peribronchial thickening, more so than on the prior examinations.  No localized airspace consolidation.  No pleural effusions.  Mild degenerative changes involving the lower thoracic spine.  IMPRESSION: Moderate to severe changes of acute bronchitis and/or  asthma.  No acute cardiopulmonary disease otherwise.  Original Report Authenticated By: Arnell Sieving, M.D.     1. Influenza       MDM  Patient has fever, tachycardia, slightly low oxygen of 93%. She appears to be uncomfortable with body aches and chills. With her cough and upper respiratory symptoms I suspect this is a flulike illness versus a pneumonia. Basic metabolic panel chest x-ray, fluids, Zofran.   01:22 - patient states that she is improved with the pain medications, repeat evaluation  with a more compliant exam at this time and patient now has expiratory wheezing. This is consistent with her x-ray showing significant bronchitis or asthma-related changes in the low oxygen saturation. Currently her oxygen level is 91% on room air. We'll give nebulizer treatment, reevaluate.   Patient reevaluated several times, continues to improve, fever improved, tachycardia has resolved and the pain essentially gone. Oxygen saturation 96% on room air after nebulizer therapy and wheezing has completely resolved. Medications given for discharge include  Tamiflu Naprosyn Albuterol Tessalon Hydrocodone  Patient given adequate followup instructions and has expressed her understanding   Vida Roller, MD 07/02/11 0428  ED ECG REPORT   Date: 07/02/2011   Rate: 114  Rhythm: sinus tachycardia  QRS Axis: normal  Intervals: normal  ST/T Wave abnormalities: normal  Conduction Disutrbances:none  Narrative Interpretation:   Old EKG Reviewed: changes noted since last tracing, HR increased   Vida Roller, MD 07/02/11 0725

## 2011-07-01 NOTE — ED Notes (Signed)
Patient complaining of nausea, vomiting, chest pain radiating into her back, and fever for 2 days. Also c/o shortness of breath and dizziness.

## 2011-07-02 LAB — BASIC METABOLIC PANEL
BUN: 9 mg/dL (ref 6–23)
Creatinine, Ser: 0.75 mg/dL (ref 0.50–1.10)
GFR calc non Af Amer: >90 mL/min (ref >90)
Glucose, Bld: 99 mg/dL (ref 70–99)
Potassium: 3.4 mEq/L — ABNORMAL LOW (ref 3.5–5.1)

## 2011-07-02 MED ORDER — HYDROCODONE-ACETAMINOPHEN 5-500 MG PO TABS: 1.0000 | ORAL_TABLET | Freq: Four times a day (QID) | ORAL | Status: AC | PRN

## 2011-07-02 MED ORDER — ALBUTEROL SULFATE HFA 108 (90 BASE) MCG/ACT IN AERS: 2.0000 | INHALATION_SPRAY | RESPIRATORY_TRACT | Status: DC | PRN

## 2011-07-02 MED ORDER — ALBUTEROL (5 MG/ML) CONTINUOUS INHALATION SOLN
10.0000 mg/h | INHALATION_SOLUTION | RESPIRATORY_TRACT | Status: AC
  Filled 2011-07-02: qty 20

## 2011-07-02 MED ORDER — SODIUM CHLORIDE 0.9 % IV BOLUS (SEPSIS)
1000.0000 mL | INTRAVENOUS | Status: AC
  Administered 2011-07-02: 1000 mL via INTRAVENOUS

## 2011-07-02 MED ORDER — ONDANSETRON 4 MG PO TBDP: 4.0000 mg | ORAL_TABLET | Freq: Three times a day (TID) | ORAL | Status: AC | PRN

## 2011-07-02 MED ORDER — OSELTAMIVIR PHOSPHATE 75 MG PO CAPS: 75.0000 mg | ORAL_CAPSULE | Freq: Two times a day (BID) | ORAL | Status: AC

## 2011-07-02 MED ORDER — BENZONATATE 200 MG PO CAPS: 200.0000 mg | ORAL_CAPSULE | Freq: Three times a day (TID) | ORAL | Status: AC | PRN

## 2011-07-02 MED ORDER — HYDROMORPHONE HCL PF 1 MG/ML IJ SOLN
1.0000 mg | Freq: Once | INTRAMUSCULAR | Status: AC
  Administered 2011-07-02: 1 mg via INTRAVENOUS
  Filled 2011-07-02: qty 1

## 2011-07-02 MED ORDER — NAPROXEN 500 MG PO TABS: 500.0000 mg | ORAL_TABLET | Freq: Two times a day (BID) | ORAL | Status: AC

## 2011-07-22 ENCOUNTER — Encounter: Payer: Self-pay | Admitting: Vascular Surgery

## 2011-07-22 ENCOUNTER — Other Ambulatory Visit: Payer: Medicare Other

## 2011-08-19 ENCOUNTER — Other Ambulatory Visit: Payer: Medicare Other

## 2011-08-19 ENCOUNTER — Encounter: Payer: Self-pay | Admitting: Vascular Surgery

## 2011-11-10 ENCOUNTER — Encounter: Payer: Self-pay | Admitting: Vascular Surgery

## 2011-11-11 ENCOUNTER — Ambulatory Visit (INDEPENDENT_AMBULATORY_CARE_PROVIDER_SITE_OTHER): Payer: Medicare Other | Admitting: Thoracic Diseases

## 2011-11-11 ENCOUNTER — Encounter (INDEPENDENT_AMBULATORY_CARE_PROVIDER_SITE_OTHER): Payer: Medicare Other | Admitting: *Deleted

## 2011-11-11 ENCOUNTER — Encounter: Payer: Self-pay | Admitting: Vascular Surgery

## 2011-11-11 VITALS — BP 109/72 | HR 76 | Resp 20 | Ht 65.0 in | Wt 134.0 lb

## 2011-11-11 DIAGNOSIS — M79603 Pain in arm, unspecified: Secondary | ICD-10-CM | POA: Insufficient documentation

## 2011-11-11 DIAGNOSIS — M79609 Pain in unspecified limb: Secondary | ICD-10-CM

## 2011-11-11 DIAGNOSIS — L988 Other specified disorders of the skin and subcutaneous tissue: Secondary | ICD-10-CM | POA: Insufficient documentation

## 2011-11-11 NOTE — Progress Notes (Signed)
[facility] HISTORY AND PHYSICAL   CC: Left biceps pain x several months Referring Physician: Dr Alveda Reasons  History of Present Illness: Martha [name] is a 46 y.o. female with history of cervical fusion and multiple back surgeries who was hospitalized in November of 2011 and had an IV infiltrate in dorsum of the left hand. She states she had swelling of the hand "like a balloon with generalized swelling in her arms and legs as well. She has since had an occasional aching type pain in the left arm especially in the biceps which gets worse when she carries something or uses the arm. The symptoms are unchanged and nothing seems to make them better. She also states she has numbness in all the fingers of her left hand and has bilat carpal tunnel syndrome. She denies coldness or pain in the hands.   No results found.  Past Medical History  Diagnosis Date  . Hyperlipidemia   . Neck pain   . History of depression   . GERD (gastroesophageal reflux disease)    Past Surgical History  Procedure Date  . Abdominal hysterectomy   . Colon surgery   . Lumbar fusion     L5-S1  . Appendectomy   . Wrist surgery     cyst removal  Cervical fusion surgery x 2   ROS: [x]  Positive   [ ]  Denies    General: [ ]  Weight loss, [ ]  Fever, [ ]  chills Neurologic: [ ]  Dizziness, [ ]  Blackouts, [ ]  Seizure [ ]  Stroke, [ ]  "Mini stroke", [ ]  Slurred speech, [ ]  Temporary blindness; [ ]  weakness in arms or legs, [ ]  Hoarseness Cardiac: [ ]  Chest pain/pressure, [ ]  Shortness of breath at rest [ ]  Shortness of breath with exertion, [ ]  Atrial fibrillation or irregular heartbeat Vascular: [ ]  Pain in legs with walking, [ ]  Pain in legs at rest, [ ]  Pain in legs at night,  [ ]  Non-healing ulcer, [ ]  Blood clot in vein/DVT,   Pulmonary: [ ]  Home oxygen, [ ]  Productive cough, [ ]  Coughing up blood, [ ]  Asthma,  [ ]  Wheezing Musculoskeletal:  [ ]  Arthritis, [x ] Low back pain, [x ] Joint  pain Hematologic: [ ]  Easy Bruising, [ ]  Anemia; [ ]  Hepatitis Gastrointestinal: [ ]  Blood in stool, [ ]  Gastroesophageal Reflux/heartburn, [ ]  Trouble swallowing Urinary: [ ]  chronic Kidney disease, [ ]  on HD - [ ]  MWF or [ ]  TTHS, [ ]  Burning with urination, [ ]  Difficulty urinating Skin: [ ]  Rashes, [ ]  Wounds Psychological: [ ]  Anxiety, [ ]  Depression   Social History History  Substance Use Topics  . Smoking status: Current Everyday Smoker -- 1.0 packs/day for 25 years    Types: Cigarettes  . Smokeless tobacco: Never Used  . Alcohol Use: No    Family History Family History  Problem Relation Age of Onset  . Cancer Other     Allergies  Allergen Reactions  . Codeine   . Morphine And Related     Current Outpatient Prescriptions  Medication Sig Dispense Refill  . albuterol (PROVENTIL HFA;VENTOLIN HFA) 108 (90 BASE) MCG/ACT inhaler Inhale 2 puffs into the lungs every 4 (four) hours as needed for wheezing or shortness of breath.  1 Inhaler  3  . carisoprodol (SOMA) 350 MG tablet Take 350 mg by mouth at bedtime.        . clonazePAM (KLONOPIN) 1 MG tablet Take 1-2  mg by mouth at bedtime as needed.       . cyclobenzaprine (FLEXERIL) 10 MG tablet Take 10 mg by mouth 3 (three) times daily as needed.        . diphenhydrAMINE (BENADRYL) 50 MG capsule Take 50 mg by mouth at bedtime as needed. For sleep       . docusate sodium (COLACE) 100 MG capsule Take 100 mg by mouth daily.        Marland Kitchen estradiol (ESTRACE) 2 MG tablet Take 2 mg by mouth daily.      . methocarbamol (ROBAXIN) 500 MG tablet Take 500-1,000 mg by mouth 3 (three) times daily.        . naproxen (NAPROSYN) 500 MG tablet Take 1 tablet (500 mg total) by mouth 2 (two) times daily with a meal.  30 tablet  0  . promethazine (PHENERGAN) 12.5 MG tablet Take 12.5-25 mg by mouth every 4 (four) hours as needed. For nausea       . rosuvastatin (CRESTOR) 20 MG tablet Take 20 mg by mouth at bedtime.        . Vitamin D, Ergocalciferol,  (DRISDOL) 50000 UNITS CAPS Take 50,000 Units by mouth every 30 (thirty) days.          Physical Examination  Filed Vitals:   11/11/11 1204  BP: 109/72  Pulse: 76  Resp: 20    Body mass index is 22.30 kg/(m^2).  General:  WDWN in NAD Gait: Normal HENT: WNL Eyes: Pupils equal Pulmonary: normal non-labored breathing , without Rales, rhonchi,  wheezing Cardiac: RRR, without  Murmurs, rubs or gallops; No carotid bruits Abdomen: soft, NT, no masses Skin: no rashes, ulcers noted Vascular Exam/Pulses: palpable and equal bilateral brachial, radial and ulnar pulses nontender over Left biceps, no masses, no signs of venous phlebitis Positive Phalens and compression tests producing numbness in the hand  she denies pain in radial nerve distribution  Extremities without ischemic changes, no Gangrene , no cellulitis; no open wounds;  Musculoskeletal: no muscle wasting or atrophy  Neurologic: A&O X 3; Appropriate Affect ; SENSATION: normal; MOTOR FUNCTION:  moving all extremities equally. Speech is fluent/normal  Non-Invasive Vascular Imaging: 11/11/2011  No DVT or SVT per duplex scan left arm  ASSESSMENT: Left arm Biceps pain - non vascular in origin with no DVT or symptoms of phlebitis. The arterial and venous systems are widely patent by exam and venous duplex No signs of shoulder impingement syndrome  PLAN: Suggested F/U with Ortho/Neuro to assess for source of probable nerve pain nerve

## 2011-11-19 NOTE — Procedures (Unsigned)
DUPLEX DEEP VENOUS EXAM - UPPER EXTREMITY  INDICATION:  Left upper extremity pain subsequent to IV placement in November 2012.  HISTORY:  Edema:  No Trauma/Surgery:  No Pain:  Yes PE:  No Previous DVT:  No Anticoagulants:  No Other:  DUPLEX EXAM:                                            Bas/               IJV   SCV     AXV    BrachV  Ceph V               R  L  R   L   R  L   R   L   R  L Thrombosis       0      0      0       0      0 Spontaneous      +      +      + Phasic           +      +      +       +      + Augmentation     +      +      +       +      + Compressible     +      +      +       + Competent Legend:  + - yes  o - no  p - partial  D - decreased  COMPRESSIBLE BAS/CEPH LEFT:  +  IMPRESSION:  No evidence of deep vein thrombosis or superficial venous thrombus in the left upper extremity.  ___________________________________________ Janetta Hora Fields, MD  LT/MEDQ  D:  11/11/2011  T:  11/11/2011  Job:  [id number]

## 2012-07-11 ENCOUNTER — Encounter (HOSPITAL_COMMUNITY): Payer: Self-pay | Admitting: *Deleted

## 2012-07-11 ENCOUNTER — Emergency Department (HOSPITAL_COMMUNITY): Payer: Medicare Other

## 2012-07-11 ENCOUNTER — Emergency Department (HOSPITAL_COMMUNITY)
Admission: EM | Admit: 2012-07-11 | Discharge: 2012-07-11 | Disposition: A | Discharge status: Home / Self Care | Payer: Medicare Other | Attending: Emergency Medicine | Admitting: Emergency Medicine

## 2012-07-11 DIAGNOSIS — E785 Hyperlipidemia, unspecified: Secondary | ICD-10-CM | POA: Insufficient documentation

## 2012-07-11 DIAGNOSIS — N201 Calculus of ureter: Secondary | ICD-10-CM | POA: Insufficient documentation

## 2012-07-11 DIAGNOSIS — Z8659 Personal history of other mental and behavioral disorders: Secondary | ICD-10-CM | POA: Insufficient documentation

## 2012-07-11 DIAGNOSIS — Z9889 Other specified postprocedural states: Secondary | ICD-10-CM | POA: Insufficient documentation

## 2012-07-11 DIAGNOSIS — R300 Dysuria: Secondary | ICD-10-CM | POA: Insufficient documentation

## 2012-07-11 DIAGNOSIS — F17200 Nicotine dependence, unspecified, uncomplicated: Secondary | ICD-10-CM | POA: Insufficient documentation

## 2012-07-11 DIAGNOSIS — Z8719 Personal history of other diseases of the digestive system: Secondary | ICD-10-CM | POA: Insufficient documentation

## 2012-07-11 DIAGNOSIS — R3915 Urgency of urination: Secondary | ICD-10-CM | POA: Insufficient documentation

## 2012-07-11 DIAGNOSIS — R350 Frequency of micturition: Secondary | ICD-10-CM | POA: Insufficient documentation

## 2012-07-11 DIAGNOSIS — Z79899 Other long term (current) drug therapy: Secondary | ICD-10-CM | POA: Insufficient documentation

## 2012-07-11 LAB — URINALYSIS, ROUTINE W REFLEX MICROSCOPIC
Bilirubin Urine: NEGATIVE
Ketones, ur: NEGATIVE mg/dL
Specific Gravity, Urine: 1.015 (ref 1.005–1.030)
Urobilinogen, UA: >8 mg/dL — ABNORMAL HIGH (ref 0.0–1.0)

## 2012-07-11 LAB — URINE MICROSCOPIC-ADD ON

## 2012-07-11 MED ORDER — HYDROMORPHONE HCL PF 1 MG/ML IJ SOLN
1.0000 mg | Freq: Once | INTRAMUSCULAR | Status: AC
  Administered 2012-07-11: 1 mg via INTRAVENOUS
  Filled 2012-07-11: qty 1

## 2012-07-11 MED ORDER — PROMETHAZINE HCL 25 MG RE SUPP: 25.0000 mg | Freq: Four times a day (QID) | RECTAL | Status: DC | PRN

## 2012-07-11 MED ORDER — TAMSULOSIN HCL 0.4 MG PO CAPS: ORAL_CAPSULE | ORAL | Status: DC

## 2012-07-11 MED ORDER — SODIUM CHLORIDE 0.9 % IV SOLN
INTRAVENOUS | Status: DC
  Administered 2012-07-11: 12:00:00 via INTRAVENOUS

## 2012-07-11 MED ORDER — METOCLOPRAMIDE HCL 5 MG/ML IJ SOLN
10.0000 mg | Freq: Once | INTRAMUSCULAR | Status: AC
  Administered 2012-07-11: 10 mg via INTRAVENOUS
  Filled 2012-07-11: qty 2

## 2012-07-11 MED ORDER — PROMETHAZINE HCL 25 MG PO TABS: 25.0000 mg | ORAL_TABLET | Freq: Three times a day (TID) | ORAL | Status: DC | PRN

## 2012-07-11 MED ORDER — DIPHENHYDRAMINE HCL 50 MG/ML IJ SOLN
50.0000 mg | Freq: Once | INTRAMUSCULAR | Status: AC
  Administered 2012-07-11: 50 mg via INTRAVENOUS
  Filled 2012-07-11: qty 1

## 2012-07-11 MED ORDER — OXYCODONE-ACETAMINOPHEN 5-325 MG PO TABS
ORAL_TABLET | ORAL | Status: DC
Start: 2012-07-11 — End: 2014-02-08

## 2012-07-11 MED ORDER — MORPHINE SULFATE 4 MG/ML IJ SOLN
4.0000 mg | Freq: Once | INTRAMUSCULAR | Status: AC
  Administered 2012-07-11: 4 mg via INTRAVENOUS
  Filled 2012-07-11: qty 1

## 2012-07-11 MED ORDER — ONDANSETRON HCL 4 MG/2ML IJ SOLN
4.0000 mg | Freq: Once | INTRAMUSCULAR | Status: AC
  Administered 2012-07-11: 4 mg via INTRAVENOUS
  Filled 2012-07-11: qty 2

## 2012-07-11 MED ORDER — TAMSULOSIN HCL 0.4 MG PO CAPS
0.4000 mg | ORAL_CAPSULE | Freq: Once | ORAL | Status: AC
  Administered 2012-07-11: 0.4 mg via ORAL
  Filled 2012-07-11: qty 1

## 2012-07-11 NOTE — ED Provider Notes (Signed)
History   This chart was scribed for Ward Givens, MD by Leone Payor, ED Scribe. This patient was seen in room APA01/APA01 and the patient's care was started at 1125.   CSN: [id number]  Arrival date & time 07/11/12  1108   First MD Initiated Contact with Patient 07/11/12 1125      Chief Complaint  Patient presents with  . Abdominal Pain     The history is provided by the patient. No language interpreter was used.    Martha [name] is a 46 y.o. female who presents to the Emergency Department complaining of  pressure in the groin region starting 6 days ago. In the last 2-3 days there has been pain in the right flank and abdomen that is described as "stabbing"  she was seen today at [facility] and was told she had blood in her urine and was diagnosed with a bladder infection. She was given a prescription. While at the  drug store the flank pain got a lot worse.Marland Kitchen Pt reports associated frequency with dribbling, urgency, dysuria and blood when she wipes and states that urination causes aggravation of the back pain. She denies any nausea, vomiting, chills, fever. Pt states that nothing makes the pain better or worse. She reports having past similar symptoms  Several months ago however it went away after a few hours. She denies any family history or history in herself of renal stones.  Pt has h/o of hysterectomy.  PCP Dr. Despina Hidden at [facility]  Back specialist Dr. Alveda Reasons  Past Medical History  Diagnosis Date  . Hyperlipidemia   . Neck pain   . History of depression   . GERD (gastroesophageal reflux disease)     Past Surgical History  Procedure Date  . Abdominal hysterectomy   . Colon surgery   . Lumbar fusion     L5-S1  . Appendectomy   . Wrist surgery     cyst removal    Family History  Problem Relation Age of Onset  . Cancer Other     History  Substance Use Topics  . Smoking status: Current Every Day Smoker -- 1.0 packs/day for 25 years    Types:  Cigarettes  . Smokeless tobacco: Never Used  . Alcohol Use: No  on disability for depression and back pain  OB History    Grav Para Term Preterm Abortions TAB SAB Ect Mult Living                  Review of Systems  Constitutional: Negative for fever and chills.  Gastrointestinal: Negative for nausea and vomiting.  Genitourinary: Positive for dysuria and hematuria.  Musculoskeletal: Positive for back pain.  All other systems reviewed and are negative.    Allergies  Codeine and Morphine and related  Home Medications   Current Outpatient Rx  Name  Route  Sig  Dispense  Refill  . ALBUTEROL SULFATE HFA 108 (90 BASE) MCG/ACT IN AERS   Inhalation   Inhale 2 puffs into the lungs every 4 (four) hours as needed for wheezing or shortness of breath.   1 Inhaler   3   . CARISOPRODOL 350 MG PO TABS   Oral   Take 350 mg by mouth at bedtime.           Marland Kitchen CLONAZEPAM 1 MG PO TABS   Oral   Take 1-2 mg by mouth at bedtime as needed.          Marland Kitchen  CYCLOBENZAPRINE HCL 10 MG PO TABS   Oral   Take 10 mg by mouth 3 (three) times daily as needed.           Marland Kitchen DIPHENHYDRAMINE HCL 50 MG PO CAPS   Oral   Take 50 mg by mouth at bedtime as needed. For sleep          . DOCUSATE SODIUM 100 MG PO CAPS   Oral   Take 100 mg by mouth daily.           Marland Kitchen ESTRADIOL 2 MG PO TABS   Oral   Take 2 mg by mouth daily.         Marland Kitchen METHOCARBAMOL 500 MG PO TABS   Oral   Take 500-1,000 mg by mouth 3 (three) times daily.           Marland Kitchen PROMETHAZINE HCL 12.5 MG PO TABS   Oral   Take 12.5-25 mg by mouth every 4 (four) hours as needed. For nausea          . ROSUVASTATIN CALCIUM 20 MG PO TABS   Oral   Take 20 mg by mouth at bedtime.           Marland Kitchen VITAMIN D (ERGOCALCIFEROL) 50000 UNITS PO CAPS   Oral   Take 50,000 Units by mouth every 30 (thirty) days.             BP 151/93  Pulse 82  Temp 97.9 F (36.6 C) (Oral)  Resp 20  Ht 5\' 5"  (1.651 m)  Wt 127 lb (57.607 kg)  BMI 21.13 kg/m2   SpO2 100%  Vital signs normal    Physical Exam  Nursing note and vitals reviewed. Constitutional: She is oriented to person, place, and time. She appears well-developed and well-nourished.  Non-toxic appearance. She does not appear ill. She appears distressed.       Tongue dry. Appears uncomfortable, moving feet and moving around on stretcher  HENT:  Head: Normocephalic and atraumatic.  Right Ear: External ear normal.  Left Ear: External ear normal.  Nose: Nose normal. No mucosal edema or rhinorrhea.  Mouth/Throat: Oropharynx is clear and moist and mucous membranes are normal. No dental abscesses or uvula swelling.  Eyes: Conjunctivae normal and EOM are normal. Pupils are equal, round, and reactive to light.  Neck: Normal range of motion and full passive range of motion without pain. Neck supple.  Cardiovascular: Normal rate, regular rhythm and normal heart sounds.  Exam reveals no gallop and no friction rub.   No murmur heard. Pulmonary/Chest: Effort normal and breath sounds normal. No respiratory distress. She has no wheezes. She has no rhonchi. She has no rales. She exhibits no tenderness and no crepitus.  Abdominal: Soft. Normal appearance and bowel sounds are normal. She exhibits no distension. There is tenderness. There is no rebound and no guarding.         Tender suprapubic region and  in the right abdomen.    Musculoskeletal: Normal range of motion. She exhibits no edema and no tenderness.       Arms:      Non tender midline lumbar spine Tender in right flank.     Neurological: She is alert and oriented to person, place, and time. She has normal strength. No cranial nerve deficit.  Skin: Skin is warm, dry and intact. No rash noted. No erythema. No pallor.  Psychiatric: She has a normal mood and affect. Her speech is normal and behavior is normal. Her  mood appears not anxious.    ED Course  Procedures (including critical care time)   Medications  0.9 %  sodium chloride  infusion (  Intravenous New Bag/Given 07/11/12 1215)  Tamsulosin HCl (FLOMAX) capsule 0.4 mg (not administered)  ondansetron (ZOFRAN) injection 4 mg (4 mg Intravenous Given 07/11/12 1220)  morphine 4 MG/ML injection 4 mg (4 mg Intravenous Given 07/11/12 1222)  diphenhydrAMINE (BENADRYL) injection 50 mg (50 mg Intravenous Given 07/11/12 1222)  metoCLOPramide (REGLAN) injection 10 mg (10 mg Intravenous Given 07/11/12 1219)  HYDROmorphone (DILAUDID) injection 1 mg (1 mg Intravenous Given 07/11/12 1400)     DIAGNOSTIC STUDIES: Oxygen Saturation is 100% on room air, normal by my interpretation.    COORDINATION OF CARE:  12:05 PM Discussed treatment plan which includes fluids through IV and morphine with pt at bedside and pt agreed to plan.  13:50 discussed CT results and discharge plans, given more pain meds. Appears more comfortable than initially when seen, calmly sitting on bed talking.   14:45 pt sitting up in NAD, states her pain is gone. Ready to go home.    Results for orders placed during the hospital encounter of 07/11/12  URINALYSIS, ROUTINE W REFLEX MICROSCOPIC      Component Value Range   Color, Urine RED (*) YELLOW   APPearance CLEAR  CLEAR   Specific Gravity, Urine 1.015  1.005 - 1.030   pH 5.0  5.0 - 8.0   Glucose, UA 500 (*) NEGATIVE mg/dL   Hgb urine dipstick LARGE (*) NEGATIVE   Bilirubin Urine NEGATIVE  NEGATIVE   Ketones, ur NEGATIVE  NEGATIVE mg/dL   Protein, ur 161 (*) NEGATIVE mg/dL   Urobilinogen, UA >0.9 (*) 0.0 - 1.0 mg/dL   Nitrite POSITIVE (*) NEGATIVE   Leukocytes, UA TRACE (*) NEGATIVE  URINE MICROSCOPIC-ADD ON      Component Value Range   Squamous Epithelial / LPF FEW (*) RARE   WBC, UA 0-2  <3 WBC/hpf   RBC / HPF TOO NUMEROUS TO COUNT  <3 RBC/hpf   Bacteria, UA MANY (*) RARE   Laboratory interpretation all normal except hematuria and bacteriuria (patient took pyridium and her urine is orange stained).   Ct Abdomen Pelvis Wo Contrast  07/11/2012   *RADIOLOGY REPORT*  Clinical Data: Left flank pain, groin pain  CT ABDOMEN AND PELVIS WITHOUT CONTRAST  Technique:  Multidetector CT imaging of the abdomen and pelvis was performed following the standard protocol without intravenous contrast.  Comparison: None.  Findings: Lung bases are unremarkable.  Postsurgical changes with metallic fixation material noted lumbar spine and L5-S1 level.  Lung bases are unremarkable.  Unenhanced liver, spleen, pancreas and adrenals are unremarkable.  No calcified gallstones are noted within gallbladder.  The unenhanced kidneys shows no nephrolithiasis.  There is mild left hydronephrosis and left hydroureter.  Bilateral no proximal or mid calcified ureteral calculi are noted.  Probable phlebolith right ovarian vein.  No aortic aneurysm.  No small bowel obstruction.  There is high density material in the left renal collecting system and left ureter.  This is highly suspicious for left urinary tract hematuria.  In axial image 67 there is a 5 mm calcified obstructive calculus in the distal left ureter about to 1 cm above the left UVJ.  The urinary bladder is empty limiting its assessment.  The patient is status post hysterectomy. Multiple surgical clips are noted within pelvis probable post hysterectomy.  Mild atherosclerotic calcifications of the abdominal aorta.  No aortic aneurysm.  There is no pericecal inflammation.  IMPRESSION:  1. There is mild left hydronephrosis and left hydroureter.  High density material within the left renal collecting system and left ureter is suspicious for hematuria. 2.  A 5 mm calcified obstructive calculus noted in distal left ureter about 1 cm above the left UVJ. 3.  The patient is status post hysterectomy.   Original Report Authenticated By: Natasha Mead, M.D.      1. Right ureteral stone     New Prescriptions   OXYCODONE-ACETAMINOPHEN (PERCOCET/ROXICET) 5-325 MG PER TABLET    Take 1 or 2 po Q 6hrs for pain   PROMETHAZINE (PHENERGAN) 25 MG  SUPPOSITORY    Place 1 suppository (25 mg total) rectally every 6 (six) hours as needed for nausea.   PROMETHAZINE (PHENERGAN) 25 MG TABLET    Take 1 tablet (25 mg total) by mouth every 8 (eight) hours as needed for nausea.   TAMSULOSIN HCL (FLOMAX) 0.4 MG CAPS    Take 1 po QD until you pass the stone.    Plan discharge  Devoria Albe, MD, FACEP   MDM   I personally performed the services described in this documentation, which was scribed in my presence. The recorded information has been reviewed and considered.    Ward Givens, MD 07/11/12 [phone]

## 2012-07-11 NOTE — ED Notes (Signed)
abd pain,back pain, seen by MD this am and while in drug store had increase in pain and came to ER.  Dx withUTI,  Had blood in urine.

## 2012-12-07 ENCOUNTER — Other Ambulatory Visit: Payer: Self-pay | Admitting: Obstetrics & Gynecology

## 2013-09-20 DIAGNOSIS — G8929 Other chronic pain: Secondary | ICD-10-CM | POA: Diagnosis not present

## 2013-09-20 DIAGNOSIS — R5381 Other malaise: Secondary | ICD-10-CM | POA: Diagnosis not present

## 2013-09-20 DIAGNOSIS — E785 Hyperlipidemia, unspecified: Secondary | ICD-10-CM | POA: Diagnosis not present

## 2013-09-20 DIAGNOSIS — F341 Dysthymic disorder: Secondary | ICD-10-CM | POA: Diagnosis not present

## 2013-09-20 DIAGNOSIS — M2550 Pain in unspecified joint: Secondary | ICD-10-CM | POA: Diagnosis not present

## 2013-10-10 DIAGNOSIS — M47812 Spondylosis without myelopathy or radiculopathy, cervical region: Secondary | ICD-10-CM | POA: Diagnosis not present

## 2013-10-10 DIAGNOSIS — M542 Cervicalgia: Secondary | ICD-10-CM | POA: Diagnosis not present

## 2013-10-10 DIAGNOSIS — M5030 Other cervical disc degeneration, unspecified cervical region: Secondary | ICD-10-CM | POA: Diagnosis not present

## 2013-10-10 DIAGNOSIS — M545 Low back pain, unspecified: Secondary | ICD-10-CM | POA: Diagnosis not present

## 2014-02-08 ENCOUNTER — Emergency Department (HOSPITAL_COMMUNITY)
Admission: EM | Admit: 2014-02-08 | Discharge: 2014-02-08 | Disposition: A | Discharge status: Home / Self Care | Payer: Medicare Other | Attending: Emergency Medicine | Admitting: Emergency Medicine

## 2014-02-08 ENCOUNTER — Encounter (HOSPITAL_COMMUNITY): Payer: Self-pay | Admitting: Emergency Medicine

## 2014-02-08 DIAGNOSIS — J069 Acute upper respiratory infection, unspecified: Secondary | ICD-10-CM | POA: Diagnosis not present

## 2014-02-08 DIAGNOSIS — N390 Urinary tract infection, site not specified: Secondary | ICD-10-CM | POA: Diagnosis not present

## 2014-02-08 DIAGNOSIS — Z87891 Personal history of nicotine dependence: Secondary | ICD-10-CM | POA: Insufficient documentation

## 2014-02-08 DIAGNOSIS — Z90710 Acquired absence of both cervix and uterus: Secondary | ICD-10-CM | POA: Insufficient documentation

## 2014-02-08 DIAGNOSIS — F329 Major depressive disorder, single episode, unspecified: Secondary | ICD-10-CM | POA: Insufficient documentation

## 2014-02-08 DIAGNOSIS — E782 Mixed hyperlipidemia: Secondary | ICD-10-CM | POA: Insufficient documentation

## 2014-02-08 DIAGNOSIS — Z9089 Acquired absence of other organs: Secondary | ICD-10-CM | POA: Insufficient documentation

## 2014-02-08 DIAGNOSIS — F3289 Other specified depressive episodes: Secondary | ICD-10-CM | POA: Diagnosis not present

## 2014-02-08 DIAGNOSIS — R059 Cough, unspecified: Secondary | ICD-10-CM | POA: Diagnosis present

## 2014-02-08 DIAGNOSIS — K219 Gastro-esophageal reflux disease without esophagitis: Secondary | ICD-10-CM | POA: Diagnosis not present

## 2014-02-08 DIAGNOSIS — Z79899 Other long term (current) drug therapy: Secondary | ICD-10-CM | POA: Diagnosis not present

## 2014-02-08 DIAGNOSIS — R05 Cough: Secondary | ICD-10-CM | POA: Diagnosis present

## 2014-02-08 DIAGNOSIS — Z9889 Other specified postprocedural states: Secondary | ICD-10-CM | POA: Diagnosis not present

## 2014-02-08 LAB — URINALYSIS, ROUTINE W REFLEX MICROSCOPIC
Bilirubin Urine: NEGATIVE
Glucose, UA: NEGATIVE mg/dL
Hgb urine dipstick: NEGATIVE
Ketones, ur: NEGATIVE mg/dL
Leukocytes, UA: NEGATIVE
Nitrite: POSITIVE — AB
PH: 5.5 (ref 5.0–8.0)
Protein, ur: NEGATIVE mg/dL
Specific Gravity, Urine: 1.02 (ref 1.005–1.030)
UROBILINOGEN UA: 1 mg/dL (ref 0.0–1.0)

## 2014-02-08 LAB — URINE MICROSCOPIC-ADD ON

## 2014-02-08 MED ORDER — NITROFURANTOIN MONOHYD MACRO 100 MG PO CAPS: 100.0000 mg | ORAL_CAPSULE | Freq: Two times a day (BID) | ORAL | Status: DC

## 2014-02-08 MED ORDER — GUAIFENESIN 100 MG/5ML PO SYRP: 200.0000 mg | ORAL_SOLUTION | Freq: Four times a day (QID) | ORAL | Status: DC | PRN

## 2014-02-08 MED ORDER — TRAMADOL HCL 50 MG PO TABS: 50.0000 mg | ORAL_TABLET | Freq: Four times a day (QID) | ORAL | Status: DC | PRN

## 2014-02-08 MED ORDER — FLUCONAZOLE 200 MG PO TABS: ORAL_TABLET | ORAL | Status: DC

## 2014-02-08 NOTE — Discharge Instructions (Signed)
Urinary Tract Infection A urinary tract infection (UTI) can occur any place along the urinary tract. The tract includes the kidneys, ureters, bladder, and urethra. A type of germ called bacteria often causes a UTI. UTIs are often helped with antibiotic medicine.  HOME CARE   If given, take antibiotics as told by your doctor. Finish them even if you start to feel better.  Drink enough fluids to keep your pee (urine) clear or pale yellow.  Avoid tea, drinks with caffeine, and bubbly (carbonated) drinks.  Pee often. Avoid holding your pee in for a long time.  Pee before and after having sex (intercourse).  Wipe from front to back after you poop (bowel movement) if you are a woman. Use each tissue only once. GET HELP RIGHT AWAY IF:   You have back pain.  You have lower belly (abdominal) pain.  You have chills.  You feel sick to your stomach (nauseous).  You throw up (vomit).  Your burning or discomfort with peeing does not go away.  You have a fever.  Your symptoms are not better in 3 days. MAKE SURE YOU:   Understand these instructions.  Will watch your condition.  Will get help right away if you are not doing well or get worse. Document Released: 01/12/2008 Document Revised: 04/19/2012 Document Reviewed: 02/24/2012 [facility] Patient Information 2015 [place], [place]. This information is not intended to replace advice given to you by your health care provider. Make sure you discuss any questions you have with your health care provider.  Upper Respiratory Infection, Adult An upper respiratory infection (URI) is also sometimes known as the common cold. The upper respiratory tract includes the nose, sinuses, throat, trachea, and bronchi. Bronchi are the airways leading to the lungs. Most people improve within 1 week, but symptoms can last up to 2 weeks. A residual cough may last even longer.  CAUSES Many different viruses can infect the tissues lining the upper respiratory tract.  The tissues become irritated and inflamed and often become very moist. Mucus production is also common. A cold is contagious. You can easily spread the virus to others by oral contact. This includes kissing, sharing a glass, coughing, or sneezing. Touching your mouth or nose and then touching a surface, which is then touched by another person, can also spread the virus. SYMPTOMS  Symptoms typically develop 1 to 3 days after you come in contact with a cold virus. Symptoms vary from person to person. They may include:  Runny nose.  Sneezing.  Nasal congestion.  Sinus irritation.  Sore throat.  Loss of voice (laryngitis).  Cough.  Fatigue.  Muscle aches.  Loss of appetite.  Headache.  Low-grade fever. DIAGNOSIS  You might diagnose your own cold based on familiar symptoms, since most people get a cold 2 to 3 times a year. Your caregiver can confirm this based on your exam. Most importantly, your caregiver can check that your symptoms are not due to another disease such as strep throat, sinusitis, pneumonia, asthma, or epiglottitis. Blood tests, throat tests, and X-rays are not necessary to diagnose a common cold, but they may sometimes be helpful in excluding other more serious diseases. Your caregiver will decide if any further tests are required. RISKS AND COMPLICATIONS  You may be at risk for a more severe case of the common cold if you smoke cigarettes, have chronic heart disease (such as heart failure) or lung disease (such as asthma), or if you have a weakened immune system. The very young and  very old are also at risk for more serious infections. Bacterial sinusitis, middle ear infections, and bacterial pneumonia can complicate the common cold. The common cold can worsen asthma and chronic obstructive pulmonary disease (COPD). Sometimes, these complications can require emergency medical care and may be life-threatening. PREVENTION  The best way to protect against getting a cold is  to practice good hygiene. Avoid oral or hand contact with people with cold symptoms. Wash your hands often if contact occurs. There is no clear evidence that vitamin C, vitamin E, echinacea, or exercise reduces the chance of developing a cold. However, it is always recommended to get plenty of rest and practice good nutrition. TREATMENT  Treatment is directed at relieving symptoms. There is no cure. Antibiotics are not effective, because the infection is caused by a virus, not by bacteria. Treatment may include:  Increased fluid intake. Sports drinks offer valuable electrolytes, sugars, and fluids.  Breathing heated mist or steam (vaporizer or shower).  Eating chicken soup or other clear broths, and maintaining good nutrition.  Getting plenty of rest.  Using gargles or lozenges for comfort.  Controlling fevers with ibuprofen or acetaminophen as directed by your caregiver.  Increasing usage of your inhaler if you have asthma. Zinc gel and zinc lozenges, taken in the first 24 hours of the common cold, can shorten the duration and lessen the severity of symptoms. Pain medicines may help with fever, muscle aches, and throat pain. A variety of non-prescription medicines are available to treat congestion and runny nose. Your caregiver can make recommendations and may suggest nasal or lung inhalers for other symptoms.  HOME CARE INSTRUCTIONS   Only take over-the-counter or prescription medicines for pain, discomfort, or fever as directed by your caregiver.  Use a warm mist humidifier or inhale steam from a shower to increase air moisture. This may keep secretions moist and make it easier to breathe.  Drink enough water and fluids to keep your urine clear or pale yellow.  Rest as needed.  Return to work when your temperature has returned to normal or as your caregiver advises. You may need to stay home longer to avoid infecting others. You can also use a face mask and careful hand washing to  prevent spread of the virus. SEEK MEDICAL CARE IF:   After the first few days, you feel you are getting worse rather than better.  You need your caregiver's advice about medicines to control symptoms.  You develop chills, worsening shortness of breath, or brown or red sputum. These may be signs of pneumonia.  You develop yellow or brown nasal discharge or pain in the face, especially when you bend forward. These may be signs of sinusitis.  You develop a fever, swollen neck glands, pain with swallowing, or white areas in the back of your throat. These may be signs of strep throat. SEEK IMMEDIATE MEDICAL CARE IF:   You have a fever.  You develop severe or persistent headache, ear pain, sinus pain, or chest pain.  You develop wheezing, a prolonged cough, cough up blood, or have a change in your usual mucus (if you have chronic lung disease).  You develop sore muscles or a stiff neck. Document Released: 01/19/2001 Document Revised: 10/18/2011 Document Reviewed: 11/27/2010 [facility] Patient Information 2015 [place], [place]. This information is not intended to replace advice given to you by your health care provider. Make sure you discuss any questions you have with your health care provider.

## 2014-02-08 NOTE — ED Notes (Addendum)
Sore throat, cough for  2 days.  Pressure sensation when voids.  No fever or chills. Alert,  Pain rt lower back.  Marland Kitchen

## 2014-02-08 NOTE — ED Notes (Addendum)
Patient began having R lumbar back pain 2 days ago w/some lower abdominal/pelvic pressure which has since resolved, but states "something just doesn't feel right down there when I pee".  Productive cough started yesterday and sore throat today.  Denies fever, urinary symptoms, vaginal discharge.

## 2014-02-09 LAB — URINE CULTURE: Colony Count: 2000

## 2014-02-10 NOTE — ED Provider Notes (Signed)
CSN: [id number]     Arrival date & time 02/08/14  1125 History   First MD Initiated Contact with Patient 02/08/14 1148     Chief Complaint  Patient presents with  . Cough  . Back Pain     (Consider location/radiation/quality/duration/timing/severity/associated sxs/prior Treatment) Patient is a 48 y.o. female presenting with cough and back pain. The history is provided by the patient.  Cough Cough characteristics:  Productive Sputum characteristics:  Yellow Severity:  Mild Onset quality:  Gradual Duration:  1 day Timing:  Intermittent Progression:  Worsening Chronicity:  New Smoker: no   Context: upper respiratory infection   Relieved by:  Nothing Ineffective treatments:  None tried Associated symptoms: sinus congestion and sore throat   Associated symptoms: no chest pain, no chills, no ear pain, no fever, no headaches, no myalgias, no rash, no rhinorrhea, no shortness of breath and no wheezing   Sore throat:    Severity:  Mild   Onset quality:  Gradual   Timing:  Constant   Progression:  Unchanged Back Pain Location:  Lumbar spine Quality:  Burning and aching Radiates to:  Does not radiate Pain severity:  Mild Pain is:  Same all the time Onset quality:  Gradual Timing:  Constant Progression:  Unchanged Chronicity:  New Context: not falling and not recent injury   Relieved by:  Nothing Worsened by:  Nothing tried Ineffective treatments:  None tried Associated symptoms: dysuria   Associated symptoms: no abdominal pain, no abdominal swelling, no bladder incontinence, no bowel incontinence, no chest pain, no fever, no headaches, no leg pain, no numbness, no paresthesias, no pelvic pain, no perianal numbness, no tingling and no weakness     Past Medical History  Diagnosis Date  . Hyperlipidemia   . Neck pain   . History of depression   . GERD (gastroesophageal reflux disease)    Past Surgical History  Procedure Laterality Date  . Abdominal hysterectomy    . Colon  surgery    . Lumbar fusion      L5-S1  . Appendectomy    . Wrist surgery      cyst removal  . Back surgery     Family History  Problem Relation Age of Onset  . Cancer Other    History  Substance Use Topics  . Smoking status: Former Smoker -- 1.00 packs/day for 25 years    Types: Cigarettes    Quit date: 03/11/2013  . Smokeless tobacco: Never Used  . Alcohol Use: No   OB History   Grav Para Term Preterm Abortions TAB SAB Ect Mult Living                 Review of Systems  Constitutional: Negative for fever and chills.  HENT: Positive for sore throat. Negative for ear pain and rhinorrhea.   Respiratory: Positive for cough. Negative for shortness of breath and wheezing.   Cardiovascular: Negative for chest pain.  Gastrointestinal: Negative for vomiting, abdominal pain, constipation and bowel incontinence.  Genitourinary: Positive for dysuria and frequency. Negative for bladder incontinence, hematuria, flank pain, decreased urine volume, vaginal bleeding, vaginal discharge, difficulty urinating, genital sores and pelvic pain.       No perineal numbness or incontinence of urine or feces  Musculoskeletal: Positive for back pain. Negative for joint swelling and myalgias.  Skin: Negative for rash.  Neurological: Negative for tingling, weakness, numbness, headaches and paresthesias.  All other systems reviewed and are negative.     Allergies  Codeine and  Morphine and related  Home Medications   Prior to Admission medications   Medication Sig Start Date End Date Taking? Authorizing Provider  clonazePAM (KLONOPIN) 1 MG tablet Take 1.5-2 mg by mouth at bedtime.   Yes Historical Provider, MD  estradiol (ESTRACE) 2 MG tablet TAKE 1 TABLET BY MOUTH ONCE A DAY. 12/07/12  Yes Florian Buff, MD  rosuvastatin (CRESTOR) 20 MG tablet Take 20 mg by mouth at bedtime.     Yes Historical Provider, MD  fluconazole (DIFLUCAN) 200 MG tablet Take one tablet in 4 days then take second tablet 4 days  later. 02/08/14   Jamela Cumbo L. Monish Haliburton, PA-C  guaifenesin (ROBITUSSIN) 100 MG/5ML syrup Take 10 mLs (200 mg total) by mouth 4 (four) times daily as needed for cough. 02/08/14   Bryne Lindon L. Kea Callan, PA-C  nitrofurantoin, macrocrystal-monohydrate, (MACROBID) 100 MG capsule Take 1 capsule (100 mg total) by mouth 2 (two) times daily. For 7 days 02/08/14   Vale Peraza L. Osiah Haring, PA-C  traMADol (ULTRAM) 50 MG tablet Take 1 tablet (50 mg total) by mouth every 6 (six) hours as needed. 02/08/14   Reola Buckles L. Kienna Moncada, PA-C   BP 125/93  Pulse 89  Temp(Src) 98 F (36.7 C) (Oral)  Resp 12  Ht 5\' 5"  (1.651 m)  Wt 139 lb (63.05 kg)  BMI 23.13 kg/m2  SpO2 98% Physical Exam  Nursing note and vitals reviewed. Constitutional: She is oriented to person, place, and time. She appears well-developed and well-nourished. No distress.  HENT:  Head: Normocephalic and atraumatic.  Mouth/Throat: Uvula is midline and mucous membranes are normal. Posterior oropharyngeal edema and posterior oropharyngeal erythema present. No oropharyngeal exudate or tonsillar abscesses.  Neck: Normal range of motion. Neck supple.  Cardiovascular: Normal rate, regular rhythm, normal heart sounds and intact distal pulses.   No murmur heard. Pulmonary/Chest: Effort normal and breath sounds normal. No respiratory distress. She has no wheezes. She has no rales. She exhibits no tenderness.  Pt is actively coughing, lung sounds are CTA  Abdominal: Soft. She exhibits no distension and no mass. There is no tenderness. There is no rebound and no guarding.  Musculoskeletal: Normal range of motion.  Lymphadenopathy:    She has no cervical adenopathy.  Neurological: She is alert and oriented to person, place, and time. She exhibits normal muscle tone. Coordination normal.  Skin: Skin is warm and dry.    ED Course  Procedures (including critical care time) Labs Review Labs Reviewed  URINALYSIS, ROUTINE W REFLEX MICROSCOPIC - Abnormal; Notable for the  following:    Color, Urine ORANGE (*)    Nitrite POSITIVE (*)    All other components within normal limits  URINE CULTURE  URINE MICROSCOPIC-ADD ON    Imaging Review No results found.   EKG Interpretation None      Urine culture pending.    MDM   Final diagnoses:  URI (upper respiratory infection)  UTI (lower urinary tract infection)    Pt is well appearing, ambulates with a steady gait.  Has made several trips to the restroom during ED stay.  Will treat for UTI.  No concerning sx's for PID, post hysterectomy.  She agrees to treatment with macrobid and requests diflucan due to frequent candida infections after abx use.  Cough is likely viral and will treat symptomatically.  No concerning sx's for PE.  Pt agrees to plan and appears stable for d/c.  Advised to return if sx's worsen.      Latysha Thackston L. Vanessa [place], PA-C 02/10/14  1806 

## 2014-02-11 NOTE — ED Provider Notes (Signed)
Medical screening examination/treatment/procedure(s) were performed by non-physician practitioner and as supervising physician I was immediately available for consultation/collaboration.   EKG Interpretation None        Sharyon Cable, MD 02/11/14 [phone]

## 2015-01-23 DIAGNOSIS — M25472 Effusion, left ankle: Secondary | ICD-10-CM | POA: Diagnosis not present

## 2015-01-23 DIAGNOSIS — S93402A Sprain of unspecified ligament of left ankle, initial encounter: Secondary | ICD-10-CM | POA: Diagnosis not present

## 2015-01-23 DIAGNOSIS — M25572 Pain in left ankle and joints of left foot: Secondary | ICD-10-CM | POA: Diagnosis not present

## 2015-01-23 DIAGNOSIS — S99912A Unspecified injury of left ankle, initial encounter: Secondary | ICD-10-CM | POA: Diagnosis not present

## 2015-04-22 DIAGNOSIS — K2900 Acute gastritis without bleeding: Secondary | ICD-10-CM | POA: Diagnosis not present

## 2015-04-22 DIAGNOSIS — M5430 Sciatica, unspecified side: Secondary | ICD-10-CM | POA: Diagnosis not present

## 2015-04-22 DIAGNOSIS — R0789 Other chest pain: Secondary | ICD-10-CM | POA: Diagnosis not present

## 2015-04-22 DIAGNOSIS — L989 Disorder of the skin and subcutaneous tissue, unspecified: Secondary | ICD-10-CM | POA: Diagnosis not present

## 2015-04-22 DIAGNOSIS — R312 Other microscopic hematuria: Secondary | ICD-10-CM | POA: Diagnosis not present

## 2015-06-20 DIAGNOSIS — L03211 Cellulitis of face: Secondary | ICD-10-CM | POA: Diagnosis not present

## 2015-11-27 ENCOUNTER — Encounter: Payer: Self-pay | Admitting: Adult Health

## 2015-11-27 ENCOUNTER — Ambulatory Visit (INDEPENDENT_AMBULATORY_CARE_PROVIDER_SITE_OTHER): Payer: Medicare Other | Admitting: Adult Health

## 2015-11-27 ENCOUNTER — Other Ambulatory Visit (HOSPITAL_COMMUNITY): Payer: Self-pay | Admitting: Adult Health

## 2015-11-27 VITALS — BP 150/92 | HR 52 | Ht 65.0 in | Wt 151.0 lb

## 2015-11-27 DIAGNOSIS — Z1211 Encounter for screening for malignant neoplasm of colon: Secondary | ICD-10-CM | POA: Diagnosis not present

## 2015-11-27 DIAGNOSIS — R5383 Other fatigue: Secondary | ICD-10-CM | POA: Diagnosis not present

## 2015-11-27 DIAGNOSIS — N958 Other specified menopausal and perimenopausal disorders: Secondary | ICD-10-CM | POA: Diagnosis not present

## 2015-11-27 DIAGNOSIS — R319 Hematuria, unspecified: Secondary | ICD-10-CM | POA: Diagnosis not present

## 2015-11-27 DIAGNOSIS — N9489 Other specified conditions associated with female genital organs and menstrual cycle: Secondary | ICD-10-CM | POA: Diagnosis not present

## 2015-11-27 DIAGNOSIS — R110 Nausea: Secondary | ICD-10-CM | POA: Diagnosis not present

## 2015-11-27 DIAGNOSIS — Z124 Encounter for screening for malignant neoplasm of cervix: Secondary | ICD-10-CM | POA: Diagnosis not present

## 2015-11-27 DIAGNOSIS — E785 Hyperlipidemia, unspecified: Secondary | ICD-10-CM | POA: Diagnosis not present

## 2015-11-27 DIAGNOSIS — Z01419 Encounter for gynecological examination (general) (routine) without abnormal findings: Secondary | ICD-10-CM | POA: Diagnosis not present

## 2015-11-27 DIAGNOSIS — R109 Unspecified abdominal pain: Secondary | ICD-10-CM | POA: Diagnosis not present

## 2015-11-27 DIAGNOSIS — Z79899 Other long term (current) drug therapy: Secondary | ICD-10-CM | POA: Diagnosis not present

## 2015-11-27 DIAGNOSIS — Z79890 Hormone replacement therapy (postmenopausal): Secondary | ICD-10-CM | POA: Insufficient documentation

## 2015-11-27 DIAGNOSIS — R52 Pain, unspecified: Secondary | ICD-10-CM | POA: Insufficient documentation

## 2015-11-27 DIAGNOSIS — R4589 Other symptoms and signs involving emotional state: Secondary | ICD-10-CM | POA: Insufficient documentation

## 2015-11-27 DIAGNOSIS — Z79818 Long term (current) use of other agents affecting estrogen receptors and estrogen levels: Secondary | ICD-10-CM

## 2015-11-27 DIAGNOSIS — R102 Pelvic and perineal pain unspecified side: Secondary | ICD-10-CM | POA: Insufficient documentation

## 2015-11-27 DIAGNOSIS — E8941 Symptomatic postprocedural ovarian failure: Secondary | ICD-10-CM | POA: Insufficient documentation

## 2015-11-27 DIAGNOSIS — Z1231 Encounter for screening mammogram for malignant neoplasm of breast: Secondary | ICD-10-CM

## 2015-11-27 HISTORY — DX: Unspecified abdominal pain: R10.9

## 2015-11-27 HISTORY — DX: Other long term (current) drug therapy: Z79.899

## 2015-11-27 HISTORY — DX: Long term (current) use of other agents affecting estrogen receptors and estrogen levels: Z79.818

## 2015-11-27 HISTORY — DX: Hematuria, unspecified: R31.9

## 2015-11-27 HISTORY — DX: Pain, unspecified: R52

## 2015-11-27 HISTORY — DX: Other symptoms and signs involving emotional state: R45.89

## 2015-11-27 HISTORY — DX: Nausea: R11.0

## 2015-11-27 HISTORY — DX: Symptomatic postprocedural ovarian failure: E89.41

## 2015-11-27 HISTORY — DX: Hyperlipidemia, unspecified: E78.5

## 2015-11-27 LAB — POCT URINALYSIS DIPSTICK
GLUCOSE UA: NEGATIVE
Leukocytes, UA: NEGATIVE
Nitrite, UA: NEGATIVE

## 2015-11-27 LAB — HEMOCCULT GUIAC POC 1CARD (OFFICE): Fecal Occult Blood, POC: NEGATIVE

## 2015-11-27 MED ORDER — ESTROGENS CONJUGATED 0.625 MG PO TABS: ORAL_TABLET | ORAL | Status: DC

## 2015-11-27 NOTE — Progress Notes (Addendum)
Patient ID: Martha [name], female   DOB: [birth year], 50 y.o.   MRN: [id number] History of Present Illness: Jess is a 50 year old white female, divorced x 3, engaged, in complaining of hot flashes, has been off her estrogen for a year, and wants to get back on it, and has pelvic pressure esp after voiding and no energy, nausea, body aches, trouble sleeping and moody. Has history of kidney stones. Has decreased sex drive too.She has some chest pain and went to ER in [place] where she lives and EKG and visit was normal.  No PCP   Current Medications, Allergies, Past Medical History, Past Surgical History, Family History and Social History were reviewed in [employer] record.     Review of Systems: Patient denies any  Daily headaches, hearing loss, blurred vision, shortness of breath, problems with bowel movements. See HPI for positives.    Physical Exam:BP 150/92 mmHg  Pulse 52  Ht 5\' 5"  (1.651 m)  Wt 151 lb (68.493 kg)  BMI 25.13 kg/m2 urine 1+ protein and trace blood  General:  Well developed, well nourished, no acute distress Skin:  Warm and dry,has numerous tattoos. Neck:  Midline trachea, normal thyroid, good ROM, no lymphadenopathy Lungs; Clear to auscultation bilaterally Breast:  No dominant palpable mass, retraction, or nipple discharge, has bilateral nipple rods Cardiovascular: Regular rate and rhythm Abdomen:  Soft, diffusely tender, no hepatosplenomegaly,has navel ring, no CVAT noted  Pelvic:  External genitalia is normal in appearance, no lesions.  The vagina is normal in appearance. Urethra has no lesions or masses. The cervix and uterus are absent, has clit rod.Adnexa are tender.Bladder is non tender, no masses felt. Rectal: Good sphincter tone, no polyps, or hemorrhoids felt.  Hemoccult negative. Extremities/musculoskeletal:  No swelling or varicosities noted, no clubbing or cyanosis Psych:  alert and cooperative,seems happy,but can be moody Discussed  estrogen therapy and she wants it again, will get labs and [place] scheduled. Face time 45 minutes.  Impression: Hot flashes due to surgical menopause Well woman gyn exam no pap Pelvic pressure Abdominal pain Nausea Fatigue Hematuria Body aches Moody  Hyperlipidemia by history Estrogen therapy    Plan: Check CBC,CMP,TSH and lipids Get Abdominal and pelvic US and mammogram 4/24 at 8 am at [facility] premarin .625 mg #30 take 1 daily with 3 refills Follow up in 2 weeks for ROS and recheck BP  Physical in 1 year  UA C&S

## 2015-11-27 NOTE — Patient Instructions (Signed)
Get [place] and mammogram 4/24 Follow up in 2 weeks Take premarin 1 daily

## 2015-11-28 ENCOUNTER — Telehealth: Payer: Self-pay | Admitting: Adult Health

## 2015-11-28 LAB — COMPREHENSIVE METABOLIC PANEL
ALT: 13 IU/L (ref 0–32)
AST: 17 IU/L (ref 0–40)
Albumin/Globulin Ratio: 1.8 (ref 1.2–2.2)
Albumin: 4.5 g/dL (ref 3.5–5.5)
Alkaline Phosphatase: 72 IU/L (ref 39–117)
BUN/Creatinine Ratio: 18 (ref 9–23)
BUN: 14 mg/dL (ref 6–24)
Bilirubin Total: 0.5 mg/dL (ref 0.0–1.2)
CALCIUM: 9.5 mg/dL (ref 8.7–10.2)
CO2: 25 mmol/L (ref 18–29)
CREATININE: 0.77 mg/dL (ref 0.57–1.00)
Chloride: 100 mmol/L (ref 96–106)
GFR calc Af Amer: 105 mL/min/{1.73_m2} (ref >59)
GFR, EST NON AFRICAN AMERICAN: 91 mL/min/{1.73_m2} (ref >59)
GLOBULIN, TOTAL: 2.5 g/dL (ref 1.5–4.5)
Glucose: 106 mg/dL — ABNORMAL HIGH (ref 65–99)
Potassium: 3.8 mmol/L (ref 3.5–5.2)
SODIUM: 141 mmol/L (ref 134–144)
Total Protein: 7 g/dL (ref 6.0–8.5)

## 2015-11-28 LAB — TSH: TSH: 1.09 u[IU]/mL (ref 0.450–4.500)

## 2015-11-28 LAB — MICROSCOPIC EXAMINATION
BACTERIA UA: NONE SEEN
Casts: NONE SEEN /lpf

## 2015-11-28 LAB — URINALYSIS, ROUTINE W REFLEX MICROSCOPIC
Bilirubin, UA: NEGATIVE
GLUCOSE, UA: NEGATIVE
KETONES UA: NEGATIVE
LEUKOCYTES UA: NEGATIVE
NITRITE UA: NEGATIVE
PH UA: 5.5 (ref 5.0–7.5)
RBC, UA: NEGATIVE
Specific Gravity, UA: 1.025 (ref 1.005–1.030)
UUROB: 0.2 mg/dL (ref 0.2–1.0)

## 2015-11-28 LAB — CBC
HEMATOCRIT: 40.4 % (ref 34.0–46.6)
HEMOGLOBIN: 13.6 g/dL (ref 11.1–15.9)
MCH: 30.2 pg (ref 26.6–33.0)
MCHC: 33.7 g/dL (ref 31.5–35.7)
MCV: 90 fL (ref 79–97)
Platelets: 205 10*3/uL (ref 150–379)
RBC: 4.5 x10E6/uL (ref 3.77–5.28)
RDW: 13.1 % (ref 12.3–15.4)
WBC: 5.8 10*3/uL (ref 3.4–10.8)

## 2015-11-28 LAB — URINE CULTURE

## 2015-11-28 LAB — LIPID PANEL
CHOLESTEROL TOTAL: 250 mg/dL — AB (ref 100–199)
Chol/HDL Ratio: 3.2 ratio units (ref 0.0–4.4)
HDL: 78 mg/dL (ref >39)
LDL CALC: 156 mg/dL — AB (ref 0–99)
Triglycerides: 79 mg/dL (ref 0–149)
VLDL CHOLESTEROL CAL: 16 mg/dL (ref 5–40)

## 2015-11-28 NOTE — Telephone Encounter (Signed)
No voice mail box.

## 2015-11-28 NOTE — Telephone Encounter (Signed)
Pt aware of labs, decrease sodas and fats

## 2015-12-01 ENCOUNTER — Ambulatory Visit (HOSPITAL_COMMUNITY)
Admission: RE | Admit: 2015-12-01 | Discharge: 2015-12-01 | Disposition: A | Discharge status: Home / Self Care | Payer: Medicare Other | Source: Ambulatory Visit | Attending: Adult Health | Admitting: Adult Health

## 2015-12-01 ENCOUNTER — Telehealth: Payer: Self-pay | Admitting: Adult Health

## 2015-12-01 DIAGNOSIS — R110 Nausea: Secondary | ICD-10-CM | POA: Diagnosis not present

## 2015-12-01 DIAGNOSIS — R102 Pelvic and perineal pain: Secondary | ICD-10-CM | POA: Diagnosis not present

## 2015-12-01 DIAGNOSIS — R109 Unspecified abdominal pain: Secondary | ICD-10-CM | POA: Diagnosis not present

## 2015-12-01 DIAGNOSIS — N9489 Other specified conditions associated with female genital organs and menstrual cycle: Secondary | ICD-10-CM | POA: Diagnosis not present

## 2015-12-01 DIAGNOSIS — Z90710 Acquired absence of both cervix and uterus: Secondary | ICD-10-CM | POA: Diagnosis not present

## 2015-12-01 DIAGNOSIS — Z1231 Encounter for screening mammogram for malignant neoplasm of breast: Secondary | ICD-10-CM | POA: Diagnosis not present

## 2015-12-01 NOTE — Telephone Encounter (Signed)
Pt aware [place] was normal on pelvic and abdomen, still having hot flashes esp at night, try taking meds at night

## 2015-12-02 ENCOUNTER — Other Ambulatory Visit: Payer: Self-pay | Admitting: *Deleted

## 2015-12-02 ENCOUNTER — Telehealth: Payer: Self-pay | Admitting: Adult Health

## 2015-12-02 MED ORDER — ESTRADIOL 1 MG PO TABS: 1.0000 mg | ORAL_TABLET | Freq: Every day | ORAL | Status: DC

## 2015-12-02 NOTE — Telephone Encounter (Signed)
Spoke with pt. Pt's insurance don't cover Premarin. JAG ordered Estrace. This has been sent to pharmacy. Pt voiced understanding. [place]

## 2015-12-04 ENCOUNTER — Telehealth: Payer: Self-pay | Admitting: Adult Health

## 2015-12-04 NOTE — Telephone Encounter (Signed)
Pt called stating that she would like for Anderson Malta to referrer her to [facility] their number is [phone]. Pt knows that Anderson Malta is not in the office and she won't be in until Monday.

## 2015-12-08 NOTE — Telephone Encounter (Signed)
No answer @ 3:05 pm, just beeps. [place]

## 2015-12-08 NOTE — Telephone Encounter (Signed)
No answer @ 4:43pm. Message says enter remote access code. [place]

## 2015-12-09 NOTE — Telephone Encounter (Signed)
I spoke with pt letting her know she should be able to make her own appt to dermatologist. Pt states she called and they told her they need referral from [place]. Thanks!! [place]

## 2015-12-09 NOTE — Telephone Encounter (Signed)
I tried to call Derm office and got no answer or answering machine. I tried to call pt and was asked for remote access code. [place]

## 2015-12-11 ENCOUNTER — Ambulatory Visit (INDEPENDENT_AMBULATORY_CARE_PROVIDER_SITE_OTHER): Payer: Medicare Other | Admitting: Adult Health

## 2015-12-11 ENCOUNTER — Encounter: Payer: Self-pay | Admitting: Adult Health

## 2015-12-11 ENCOUNTER — Telehealth: Payer: Self-pay | Admitting: *Deleted

## 2015-12-11 VITALS — BP 126/82 | HR 64 | Ht 65.0 in | Wt 149.5 lb

## 2015-12-11 DIAGNOSIS — L298 Other pruritus: Secondary | ICD-10-CM | POA: Diagnosis not present

## 2015-12-11 DIAGNOSIS — Z79899 Other long term (current) drug therapy: Secondary | ICD-10-CM | POA: Diagnosis not present

## 2015-12-11 DIAGNOSIS — N898 Other specified noninflammatory disorders of vagina: Secondary | ICD-10-CM | POA: Insufficient documentation

## 2015-12-11 HISTORY — DX: Other specified noninflammatory disorders of vagina: N89.8

## 2015-12-11 LAB — POCT WET PREP (WET MOUNT): Clue Cells Wet Prep Whiff POC: NEGATIVE

## 2015-12-11 NOTE — Progress Notes (Signed)
Subjective:     Patient ID: Francisco Dohrmann, female   DOB: 04/16/1966, 50 y.o.   MRN: [id number]  HPI Devonie is a 50 year old white female, in for BP check after starting ET again, about 2 weeks ago, still with some hot flashes and notices vaginal itching at time. She may get GYN closer home.  Review of Systems  Patient denies any headaches, hearing loss, fatigue, blurred vision, shortness of breath, chest pain, abdominal pain, problems with bowel movements, urination, or intercourse. No joint pain or mood swings.See HPI for positives. Reviewed past medical,surgical, social and family history. Reviewed medications and allergies.     Objective:   Physical Exam BP 126/82 mmHg  Pulse 64  Ht 5\' 5"  (1.651 m)  Wt 149 lb 8 oz (67.813 kg)  BMI 24.88 kg/m2 Skin warm and dry.Pelvic: external genitalia is normal in appearance no lesions, vagina: scant discharge without odor,good color, and moisture today,urethra has no lesions or masses noted, cervix and uterus are absent, adnexa: no masses or tenderness noted. Bladder is non tender and no masses felt. Wet prep: few +WBCs.    Assessment:     Vaginal itch Current use of estrogen    Plan:     Try luvena or replens to balance PH Continue estrace 1 daily Follow up prn

## 2015-12-11 NOTE — Telephone Encounter (Signed)
Left message x 1. JSY 

## 2015-12-11 NOTE — Telephone Encounter (Signed)
Spoke with pt letting her know I tried to call the derm office and no one answered. Not even an answering machine. Pt has an appt today and she will bring the correct number with her. [place]

## 2015-12-11 NOTE — Telephone Encounter (Signed)
Left message letting pt know I called the derm office and they sent over a paper to fill out and fax back to them. That was done and faxed back. Advised pt should hear from them within 1-2 weeks and if she doesn't, call them back. Office is [facility] [employer]. Phone # [phone], fax # [phone]. [place]

## 2015-12-11 NOTE — Patient Instructions (Addendum)
Continue estrace Follow up prn Try luvena or replens

## 2015-12-12 DIAGNOSIS — C44519 Basal cell carcinoma of skin of other part of trunk: Secondary | ICD-10-CM | POA: Diagnosis not present

## 2015-12-12 DIAGNOSIS — B351 Tinea unguium: Secondary | ICD-10-CM | POA: Diagnosis not present

## 2015-12-15 DIAGNOSIS — H43813 Vitreous degeneration, bilateral: Secondary | ICD-10-CM | POA: Diagnosis not present

## 2015-12-25 DIAGNOSIS — C44519 Basal cell carcinoma of skin of other part of trunk: Secondary | ICD-10-CM | POA: Diagnosis not present

## 2015-12-29 DIAGNOSIS — S20212A Contusion of left front wall of thorax, initial encounter: Secondary | ICD-10-CM | POA: Diagnosis not present

## 2015-12-29 DIAGNOSIS — T148 Other injury of unspecified body region: Secondary | ICD-10-CM | POA: Diagnosis not present

## 2016-04-11 ENCOUNTER — Other Ambulatory Visit: Payer: Self-pay | Admitting: Adult Health

## 2016-06-03 ENCOUNTER — Other Ambulatory Visit: Payer: Self-pay | Admitting: *Deleted

## 2016-06-03 MED ORDER — ESTRADIOL 1 MG PO TABS: 1.0000 mg | ORAL_TABLET | Freq: Every day | ORAL | 3 refills | Status: AC

## 2016-09-26 DIAGNOSIS — R079 Chest pain, unspecified: Secondary | ICD-10-CM | POA: Diagnosis not present

## 2016-09-26 DIAGNOSIS — M546 Pain in thoracic spine: Secondary | ICD-10-CM | POA: Diagnosis not present

## 2016-12-08 ENCOUNTER — Telehealth: Payer: Self-pay | Admitting: *Deleted

## 2016-12-08 NOTE — Telephone Encounter (Signed)
Told her to get PCP appt and then referral

## 2017-06-02 DIAGNOSIS — J42 Unspecified chronic bronchitis: Secondary | ICD-10-CM | POA: Diagnosis not present

## 2017-11-15 DIAGNOSIS — F4320 Adjustment disorder, unspecified: Secondary | ICD-10-CM | POA: Diagnosis not present

## 2017-11-15 DIAGNOSIS — Z23 Encounter for immunization: Secondary | ICD-10-CM | POA: Diagnosis not present

## 2017-11-15 DIAGNOSIS — R5383 Other fatigue: Secondary | ICD-10-CM | POA: Diagnosis not present

## 2017-11-15 DIAGNOSIS — Z1231 Encounter for screening mammogram for malignant neoplasm of breast: Secondary | ICD-10-CM | POA: Diagnosis not present

## 2017-11-15 DIAGNOSIS — E8941 Symptomatic postprocedural ovarian failure: Secondary | ICD-10-CM | POA: Diagnosis not present

## 2017-11-29 DIAGNOSIS — Z0000 Encounter for general adult medical examination without abnormal findings: Secondary | ICD-10-CM | POA: Diagnosis not present

## 2017-11-29 DIAGNOSIS — Z13220 Encounter for screening for lipoid disorders: Secondary | ICD-10-CM | POA: Diagnosis not present

## 2017-11-29 DIAGNOSIS — Z124 Encounter for screening for malignant neoplasm of cervix: Secondary | ICD-10-CM | POA: Diagnosis not present

## 2017-11-29 DIAGNOSIS — F4320 Adjustment disorder, unspecified: Secondary | ICD-10-CM | POA: Diagnosis not present

## 2017-11-29 DIAGNOSIS — Z87891 Personal history of nicotine dependence: Secondary | ICD-10-CM | POA: Diagnosis not present

## 2017-11-29 DIAGNOSIS — N393 Stress incontinence (female) (male): Secondary | ICD-10-CM | POA: Diagnosis not present

## 2017-11-29 DIAGNOSIS — E785 Hyperlipidemia, unspecified: Secondary | ICD-10-CM | POA: Diagnosis not present

## 2017-12-09 DIAGNOSIS — Z1231 Encounter for screening mammogram for malignant neoplasm of breast: Secondary | ICD-10-CM | POA: Diagnosis not present

## 2017-12-24 DIAGNOSIS — R1084 Generalized abdominal pain: Secondary | ICD-10-CM | POA: Diagnosis not present

## 2017-12-26 DIAGNOSIS — R5383 Other fatigue: Secondary | ICD-10-CM | POA: Diagnosis not present

## 2017-12-26 DIAGNOSIS — N941 Unspecified dyspareunia: Secondary | ICD-10-CM | POA: Diagnosis not present

## 2017-12-26 DIAGNOSIS — N8111 Cystocele, midline: Secondary | ICD-10-CM | POA: Diagnosis not present

## 2017-12-26 DIAGNOSIS — Z79899 Other long term (current) drug therapy: Secondary | ICD-10-CM | POA: Diagnosis not present

## 2017-12-26 DIAGNOSIS — R6882 Decreased libido: Secondary | ICD-10-CM | POA: Diagnosis not present

## 2017-12-26 DIAGNOSIS — E8941 Symptomatic postprocedural ovarian failure: Secondary | ICD-10-CM | POA: Diagnosis not present

## 2017-12-26 DIAGNOSIS — N3941 Urge incontinence: Secondary | ICD-10-CM | POA: Diagnosis not present

## 2017-12-26 DIAGNOSIS — R198 Other specified symptoms and signs involving the digestive system and abdomen: Secondary | ICD-10-CM | POA: Diagnosis not present

## 2018-01-09 DIAGNOSIS — N3941 Urge incontinence: Secondary | ICD-10-CM | POA: Diagnosis not present

## 2018-01-09 DIAGNOSIS — E8941 Symptomatic postprocedural ovarian failure: Secondary | ICD-10-CM | POA: Diagnosis not present

## 2018-01-10 DIAGNOSIS — E8941 Symptomatic postprocedural ovarian failure: Secondary | ICD-10-CM | POA: Diagnosis not present

## 2018-01-10 DIAGNOSIS — E782 Mixed hyperlipidemia: Secondary | ICD-10-CM | POA: Diagnosis not present

## 2018-01-10 DIAGNOSIS — K219 Gastro-esophageal reflux disease without esophagitis: Secondary | ICD-10-CM | POA: Diagnosis not present

## 2018-01-10 DIAGNOSIS — F4320 Adjustment disorder, unspecified: Secondary | ICD-10-CM | POA: Diagnosis not present

## 2018-03-06 DIAGNOSIS — E8941 Symptomatic postprocedural ovarian failure: Secondary | ICD-10-CM | POA: Diagnosis not present

## 2018-03-06 DIAGNOSIS — N3941 Urge incontinence: Secondary | ICD-10-CM | POA: Diagnosis not present

## 2018-05-08 DIAGNOSIS — Z79899 Other long term (current) drug therapy: Secondary | ICD-10-CM | POA: Diagnosis not present

## 2018-05-08 DIAGNOSIS — N3941 Urge incontinence: Secondary | ICD-10-CM | POA: Diagnosis not present

## 2018-05-08 DIAGNOSIS — E8941 Symptomatic postprocedural ovarian failure: Secondary | ICD-10-CM | POA: Diagnosis not present

## 2018-05-16 DIAGNOSIS — G4700 Insomnia, unspecified: Secondary | ICD-10-CM | POA: Diagnosis not present

## 2018-05-16 DIAGNOSIS — G4762 Sleep related leg cramps: Secondary | ICD-10-CM | POA: Diagnosis not present

## 2018-05-16 DIAGNOSIS — E782 Mixed hyperlipidemia: Secondary | ICD-10-CM | POA: Diagnosis not present

## 2018-05-16 DIAGNOSIS — R5383 Other fatigue: Secondary | ICD-10-CM | POA: Diagnosis not present

## 2018-05-17 DIAGNOSIS — N958 Other specified menopausal and perimenopausal disorders: Secondary | ICD-10-CM | POA: Diagnosis not present

## 2018-05-17 DIAGNOSIS — G4700 Insomnia, unspecified: Secondary | ICD-10-CM | POA: Diagnosis not present

## 2018-05-17 DIAGNOSIS — R5383 Other fatigue: Secondary | ICD-10-CM | POA: Diagnosis not present

## 2018-05-17 DIAGNOSIS — E559 Vitamin D deficiency, unspecified: Secondary | ICD-10-CM | POA: Diagnosis not present

## 2018-05-17 DIAGNOSIS — E8941 Symptomatic postprocedural ovarian failure: Secondary | ICD-10-CM | POA: Diagnosis not present

## 2018-05-17 DIAGNOSIS — Z808 Family history of malignant neoplasm of other organs or systems: Secondary | ICD-10-CM | POA: Diagnosis not present

## 2018-05-17 DIAGNOSIS — D510 Vitamin B12 deficiency anemia due to intrinsic factor deficiency: Secondary | ICD-10-CM | POA: Diagnosis not present

## 2018-05-17 DIAGNOSIS — G4762 Sleep related leg cramps: Secondary | ICD-10-CM | POA: Diagnosis not present

## 2018-05-17 DIAGNOSIS — E782 Mixed hyperlipidemia: Secondary | ICD-10-CM | POA: Diagnosis not present

## 2018-06-16 DIAGNOSIS — M62830 Muscle spasm of back: Secondary | ICD-10-CM | POA: Diagnosis not present

## 2018-06-16 DIAGNOSIS — Z6830 Body mass index (BMI) 30.0-30.9, adult: Secondary | ICD-10-CM | POA: Diagnosis not present

## 2018-06-16 DIAGNOSIS — E8941 Symptomatic postprocedural ovarian failure: Secondary | ICD-10-CM | POA: Diagnosis not present

## 2018-06-16 DIAGNOSIS — G2581 Restless legs syndrome: Secondary | ICD-10-CM | POA: Diagnosis not present

## 2018-07-10 DIAGNOSIS — M85852 Other specified disorders of bone density and structure, left thigh: Secondary | ICD-10-CM | POA: Diagnosis not present

## 2018-07-10 DIAGNOSIS — M8580 Other specified disorders of bone density and structure, unspecified site: Secondary | ICD-10-CM | POA: Diagnosis not present

## 2018-07-10 DIAGNOSIS — N959 Unspecified menopausal and perimenopausal disorder: Secondary | ICD-10-CM | POA: Diagnosis not present

## 2018-07-18 DIAGNOSIS — G8929 Other chronic pain: Secondary | ICD-10-CM | POA: Diagnosis not present

## 2018-07-18 DIAGNOSIS — E785 Hyperlipidemia, unspecified: Secondary | ICD-10-CM | POA: Diagnosis not present

## 2018-07-18 DIAGNOSIS — R5383 Other fatigue: Secondary | ICD-10-CM | POA: Diagnosis not present

## 2018-07-18 DIAGNOSIS — E559 Vitamin D deficiency, unspecified: Secondary | ICD-10-CM | POA: Diagnosis not present

## 2018-07-18 DIAGNOSIS — E782 Mixed hyperlipidemia: Secondary | ICD-10-CM | POA: Diagnosis not present

## 2018-08-29 DIAGNOSIS — F322 Major depressive disorder, single episode, severe without psychotic features: Secondary | ICD-10-CM | POA: Insufficient documentation

## 2018-08-29 DIAGNOSIS — F419 Anxiety disorder, unspecified: Secondary | ICD-10-CM | POA: Insufficient documentation

## 2019-08-01 ENCOUNTER — Ambulatory Visit: Payer: Medicare Other | Attending: Internal Medicine

## 2019-08-01 ENCOUNTER — Other Ambulatory Visit: Payer: Self-pay

## 2019-08-01 DIAGNOSIS — Z20822 Contact with and (suspected) exposure to covid-19: Secondary | ICD-10-CM

## 2019-08-02 ENCOUNTER — Other Ambulatory Visit: Payer: Self-pay

## 2019-08-02 LAB — NOVEL CORONAVIRUS, NAA: SARS-CoV-2, NAA: NOT DETECTED

## 2019-08-04 ENCOUNTER — Telehealth: Payer: Self-pay | Admitting: General Practice

## 2019-08-04 NOTE — Telephone Encounter (Signed)
Patient is calling to receive her negative COVID test results. Patient expressed understanding. 

## 2020-02-12 ENCOUNTER — Encounter (HOSPITAL_COMMUNITY): Payer: Self-pay | Admitting: Emergency Medicine

## 2020-02-12 ENCOUNTER — Emergency Department (HOSPITAL_COMMUNITY): Payer: Medicare Other

## 2020-02-12 ENCOUNTER — Emergency Department (HOSPITAL_COMMUNITY)
Admission: EM | Admit: 2020-02-12 | Discharge: 2020-02-12 | Disposition: A | Discharge status: Home / Self Care | Payer: Medicare Other | Attending: Emergency Medicine | Admitting: Emergency Medicine

## 2020-02-12 ENCOUNTER — Ambulatory Visit: Payer: Medicare Other | Admitting: Orthopaedic Surgery

## 2020-02-12 ENCOUNTER — Other Ambulatory Visit: Payer: Self-pay

## 2020-02-12 DIAGNOSIS — R202 Paresthesia of skin: Secondary | ICD-10-CM | POA: Insufficient documentation

## 2020-02-12 DIAGNOSIS — N189 Chronic kidney disease, unspecified: Secondary | ICD-10-CM | POA: Diagnosis not present

## 2020-02-12 DIAGNOSIS — R42 Dizziness and giddiness: Secondary | ICD-10-CM | POA: Diagnosis not present

## 2020-02-12 DIAGNOSIS — E860 Dehydration: Secondary | ICD-10-CM | POA: Insufficient documentation

## 2020-02-12 DIAGNOSIS — Z87891 Personal history of nicotine dependence: Secondary | ICD-10-CM | POA: Diagnosis not present

## 2020-02-12 HISTORY — DX: Post-traumatic stress disorder, unspecified: F43.10

## 2020-02-12 HISTORY — DX: Cardiac murmur, unspecified: R01.1

## 2020-02-12 HISTORY — DX: Attention-deficit hyperactivity disorder, unspecified type: F90.9

## 2020-02-12 LAB — URINALYSIS, ROUTINE W REFLEX MICROSCOPIC
Bilirubin Urine: NEGATIVE
Glucose, UA: NEGATIVE mg/dL
Hgb urine dipstick: NEGATIVE
Ketones, ur: NEGATIVE mg/dL
Leukocytes,Ua: NEGATIVE
Nitrite: NEGATIVE
Protein, ur: NEGATIVE mg/dL
Specific Gravity, Urine: 1.017 (ref 1.005–1.030)
pH: 5 (ref 5.0–8.0)

## 2020-02-12 LAB — BASIC METABOLIC PANEL
Anion gap: 10 (ref 5–15)
BUN: 18 mg/dL (ref 6–20)
CO2: 30 mmol/L (ref 22–32)
Calcium: 9.7 mg/dL (ref 8.9–10.3)
Chloride: 101 mmol/L (ref 98–111)
Creatinine, Ser: 0.69 mg/dL (ref 0.44–1.00)
GFR calc Af Amer: >60 mL/min (ref >60)
GFR calc non Af Amer: >60 mL/min (ref >60)
Glucose, Bld: 111 mg/dL — ABNORMAL HIGH (ref 70–99)
Potassium: 4.4 mmol/L (ref 3.5–5.1)
Sodium: 141 mmol/L (ref 135–145)

## 2020-02-12 LAB — CBC
HCT: 43.2 % (ref 36.0–46.0)
Hemoglobin: 14 g/dL (ref 12.0–15.0)
MCH: 31 pg (ref 26.0–34.0)
MCHC: 32.4 g/dL (ref 30.0–36.0)
MCV: 95.8 fL (ref 80.0–100.0)
Platelets: 170 10*3/uL (ref 150–400)
RBC: 4.51 MIL/uL (ref 3.87–5.11)
RDW: 12.1 % (ref 11.5–15.5)
WBC: 5.7 10*3/uL (ref 4.0–10.5)
nRBC: 0 % (ref 0.0–0.2)

## 2020-02-12 LAB — CBG MONITORING, ED: Glucose-Capillary: 101 mg/dL — ABNORMAL HIGH (ref 70–99)

## 2020-02-12 MED ORDER — SODIUM CHLORIDE 0.9 % IV BOLUS
1000.0000 mL | Freq: Once | INTRAVENOUS | Status: AC
  Administered 2020-02-12: 1000 mL via INTRAVENOUS

## 2020-02-12 NOTE — Discharge Instructions (Addendum)
You were seen in the emergency department for a few weeks of dizziness lightheadedness and also with numbness and tingling over your whole body.  Your CAT scan did not show any serious findings.  Your blood work was also fairly unremarkable.  You were given IV fluids here.  Please contact your primary care doctor for close follow-up.  Try to stay well-hydrated.  Return if any worsening or concerning symptoms

## 2020-02-12 NOTE — ED Triage Notes (Addendum)
Pt reports lightheadedness, dizziness, generalized tingling constant for last several weeks with worsening over last several days. Pt ambulates with steady gait. Denies any visual changes. Facial symmetry noted. No drift, sensation intact.

## 2020-02-12 NOTE — ED Provider Notes (Signed)
[facility] Provider Note   CSN: [id number] Arrival date & time: 02/12/20  1559     History Chief Complaint  Patient presents with  . Dizziness    Martha [name] is a 54 y.o. female.  She is complaining of 2 or 3 weeks of dizziness lightheadedness like she might pass out.  It is associated with whole body tingling/numbness.  No blurry vision double vision.  No difficulty speaking.  No focal weakness.  She says has been getting progressively worse and sometimes occurs while she is sitting and driving or at work.  She was recently started on Wellbutrin but she said the symptoms began before that.  She said she has had this dizziness before but never this bad.  Has not seen her doctor for this.  The history is provided by the patient.  Dizziness Quality:  Lightheadedness Severity:  Severe Onset quality:  Gradual Timing:  Constant Progression:  Worsening Chronicity:  New Context: physical activity and standing up   Context: not with loss of consciousness   Relieved by:  Nothing Worsened by:  Standing up Ineffective treatments:  None tried Associated symptoms: headaches (sometimes) and weakness (generalized)   Associated symptoms: no blood in stool, no chest pain, no palpitations, no shortness of breath, no syncope, no tinnitus, no vision changes and no vomiting   Risk factors: new medications        Past Medical History:  Diagnosis Date  . Abdominal pain in female 11/27/2015  . ADHD   . Body aches 11/27/2015  . Chronic kidney disease    kidney stones  . Current use of estrogen therapy 11/27/2015  . GERD (gastroesophageal reflux disease)   . Heart murmur   . Hematuria 11/27/2015  . History of depression   . Hot flashes due to surgical menopause 11/27/2015  . Hyperlipemia 11/27/2015  . Hyperlipidemia   . Mental disorder    history of depression  . Moody 11/27/2015  . Nausea 11/27/2015  . Neck pain   . PTSD (post-traumatic stress disorder)   . Vaginal  itching 12/11/2015    Patient Active Problem List   Diagnosis Date Noted  . Vaginal itching 12/11/2015  . Hyperlipemia 11/27/2015  . Hot flashes due to surgical menopause 11/27/2015  . Abdominal pain in female 11/27/2015  . Nausea 11/27/2015  . Pelvic pressure in female 11/27/2015  . Hematuria 11/27/2015  . Body aches 11/27/2015  . Moody 11/27/2015  . Current use of estrogen therapy 11/27/2015  . Arm pain 11/11/2011  . Other specified disorder of skin 11/11/2011    Past Surgical History:  Procedure Laterality Date  . ABDOMINAL HYSTERECTOMY    . APPENDECTOMY    . BACK SURGERY    . COLON SURGERY    . LUMBAR FUSION     L5-S1  . NECK SURGERY    . WRIST SURGERY     cyst removal     OB History    Gravida  3   Para  2   Term      Preterm      AB  1   Living  2     SAB  1   TAB      Ectopic      Multiple      Live Births              Family History  Problem Relation Age of Onset  . Cancer Mother        bone  .  Emphysema Father   . Cancer Other     Social History   Tobacco Use  . Smoking status: Former Smoker    Packs/day: 1.00    Years: 25.00    Pack years: 25.00    Types: Cigarettes    Quit date: 03/11/2013    Years since quitting: 6.9  . Smokeless tobacco: Never Used  . Tobacco comment: vapes  Substance Use Topics  . Alcohol use: No  . Drug use: No    Home Medications Prior to Admission medications   Medication Sig Start Date End Date Taking? Authorizing Provider  acetaminophen (TYLENOL) 325 MG tablet Take 650 mg by mouth as needed.    [provider]  estradiol (ESTRACE) 1 MG tablet Take 1 tablet (1 mg total) by mouth daily. 06/03/16   Estill Dooms, NP  rosuvastatin (CRESTOR) 20 MG tablet Take 20 mg by mouth. Twice a week. Takes it when she remembers.    [provider]    Allergies    Codeine and Morphine and related  Review of Systems   Review of Systems  Constitutional: Negative for fever.  HENT:  Negative for sore throat and tinnitus.   Eyes: Negative for visual disturbance.  Respiratory: Negative for shortness of breath.   Cardiovascular: Negative for chest pain, palpitations and syncope.  Gastrointestinal: Negative for abdominal pain, blood in stool and vomiting.  Genitourinary: Negative for dysuria.  Musculoskeletal: Negative for neck pain.  Skin: Negative for rash.  Neurological: Positive for dizziness, weakness (generalized), light-headedness, numbness and headaches (sometimes). Negative for facial asymmetry and speech difficulty.    Physical Exam Updated Vital Signs BP (!) 149/96 (BP Location: Right Arm)   Pulse 65   Temp 98 F (36.7 C) (Oral)   Resp 18   Ht 5\' 5"  (1.651 m)   Wt 70.3 kg   SpO2 94%   BMI 25.79 kg/m   Physical Exam Vitals and nursing note reviewed.  Constitutional:      General: She is not in acute distress.    Appearance: Normal appearance. She is well-developed.  HENT:     Head: Normocephalic and atraumatic.  Eyes:     Conjunctiva/sclera: Conjunctivae normal.  Cardiovascular:     Rate and Rhythm: Normal rate and regular rhythm.     Heart sounds: No murmur heard.   Pulmonary:     Effort: Pulmonary effort is normal. No respiratory distress.     Breath sounds: Normal breath sounds.  Abdominal:     Palpations: Abdomen is soft.     Tenderness: There is no abdominal tenderness.  Musculoskeletal:        General: No deformity or signs of injury. Normal range of motion.     Cervical back: Neck supple.  Skin:    General: Skin is warm and dry.  Neurological:     General: No focal deficit present.     Mental Status: She is alert and oriented to person, place, and time.     Cranial Nerves: No cranial nerve deficit.     Sensory: No sensory deficit.     Motor: No weakness.     ED Results / Procedures / Treatments   Labs (all labs ordered are listed, but only abnormal results are displayed) Labs Reviewed  BASIC METABOLIC PANEL - Abnormal;  Notable for the following components:      Result Value   Glucose, Bld 111 (*)    All other components within normal limits  CBG MONITORING, ED - Abnormal;  Notable for the following components:   Glucose-Capillary 101 (*)    All other components within normal limits  CBC  URINALYSIS, ROUTINE W REFLEX MICROSCOPIC    EKG EKG Interpretation  Date/Time:  Tuesday February 12 2020 16:14:14 EDT Ventricular Rate:  65 PR Interval:  150 QRS Duration: 86 QT Interval:  400 QTC Calculation: 416 R Axis:   67 Text Interpretation: Normal sinus rhythm Normal ECG Since last tracing rate slower 11/12 Confirmed by Aletta Edouard [phone]) on 02/12/2020 5:13:52 PM   Radiology CT Head Wo Contrast  Result Date: 02/12/2020 CLINICAL DATA:  Dizziness. EXAM: CT HEAD WITHOUT CONTRAST TECHNIQUE: Contiguous axial images were obtained from the base of the skull through the vertex without intravenous contrast. COMPARISON:  None. FINDINGS: Brain: No evidence of acute infarction, hemorrhage, hydrocephalus, extra-axial collection or mass lesion/mass effect. Vascular: No hyperdense vessel or unexpected calcification. Skull: Normal. Negative for fracture or focal lesion. Sinuses/Orbits: No acute finding. Other: None. IMPRESSION: No acute intracranial pathology. Electronically Signed   By: Virgina Norfolk M.D.   On: 02/12/2020 22:08    Procedures Procedures (including critical care time)  Medications Ordered in ED Medications  sodium chloride 0.9 % bolus 1,000 mL (has no administration in time range)    ED Course  I have reviewed the triage vital signs and the nursing notes.  Pertinent labs & imaging results that were available during my care of the patient were reviewed by me and considered in my medical decision making (see chart for details).  Clinical Course as of Feb 12 930  Tue Feb 12, 2020  2307 Patient is orthostatic here with blood pressures dropping from 1 51-1 32.  Heart rate also rose.  We will give a  liter of fluid.  Lab work and CT imaging otherwise unremarkable.  Reviewed with patient.  Will need close follow-up with her primary care doctor.   [MB]    Clinical Course User Index [MB] Hayden Rasmussen, MD   MDM Rules/Calculators/A&P                         This patient complains of dizzy lightheaded, whole body tingling; this involves an extensive number of treatment Options and is a complaint that carries with it a high risk of complications and Morbidity. The differential includes hypovolemia, stroke, arrhythmia, metabolic derangement, anemia, hypocalcemia  I ordered, reviewed and interpreted labs, which included CBC with normal white count normal hemoglobin, electrolytes normal other than mildly elevated glucose, urinalysis negative I ordered medication IV fluids I ordered imaging studies which included CT head and I independently    visualized and interpreted imaging which showed no acute findings Previous records obtained and reviewed in epic, no prior visits for same  After the interventions stated above, I reevaluated the patient and found patient to be neurologically intact and vital signs stable. She was orthostatic and so was given IV fluids. Reviewed work-up with her and recommend close PCP follow-up. Return instructions discussed   Final Clinical Impression(s) / ED Diagnoses Final diagnoses:  Dizziness  Dehydration  Paresthesias    Rx / DC Orders ED Discharge Orders    None       Hayden Rasmussen, MD 02/13/20 [phone]

## 2020-02-12 NOTE — ED Notes (Signed)
Pt transported to CT ?

## 2020-06-17 ENCOUNTER — Other Ambulatory Visit (HOSPITAL_COMMUNITY): Payer: Self-pay | Admitting: Family

## 2020-06-17 DIAGNOSIS — Z1231 Encounter for screening mammogram for malignant neoplasm of breast: Secondary | ICD-10-CM

## 2020-06-26 ENCOUNTER — Other Ambulatory Visit (HOSPITAL_COMMUNITY): Payer: Self-pay | Admitting: Family

## 2020-06-26 DIAGNOSIS — N959 Unspecified menopausal and perimenopausal disorder: Secondary | ICD-10-CM

## 2020-07-07 ENCOUNTER — Ambulatory Visit (HOSPITAL_COMMUNITY)
Admission: RE | Admit: 2020-07-07 | Discharge: 2020-07-07 | Disposition: A | Discharge status: Home / Self Care | Payer: Medicare Other | Source: Ambulatory Visit | Attending: Family | Admitting: Family

## 2020-07-07 ENCOUNTER — Other Ambulatory Visit: Payer: Self-pay

## 2020-07-07 DIAGNOSIS — Z1231 Encounter for screening mammogram for malignant neoplasm of breast: Secondary | ICD-10-CM | POA: Diagnosis not present

## 2020-07-08 ENCOUNTER — Other Ambulatory Visit (HOSPITAL_COMMUNITY): Payer: Self-pay | Admitting: Family

## 2020-07-08 DIAGNOSIS — R928 Other abnormal and inconclusive findings on diagnostic imaging of breast: Secondary | ICD-10-CM

## 2020-07-11 ENCOUNTER — Ambulatory Visit (HOSPITAL_COMMUNITY)
Admission: RE | Admit: 2020-07-11 | Discharge: 2020-07-11 | Disposition: A | Discharge status: Home / Self Care | Payer: Medicare Other | Source: Ambulatory Visit | Attending: Family | Admitting: Family

## 2020-07-11 ENCOUNTER — Other Ambulatory Visit: Payer: Self-pay

## 2020-07-11 DIAGNOSIS — R928 Other abnormal and inconclusive findings on diagnostic imaging of breast: Secondary | ICD-10-CM

## 2020-11-04 ENCOUNTER — Other Ambulatory Visit (HOSPITAL_COMMUNITY): Payer: Medicare Other

## 2020-11-06 ENCOUNTER — Other Ambulatory Visit (HOSPITAL_COMMUNITY): Payer: Medicare Other

## 2020-11-06 ENCOUNTER — Other Ambulatory Visit: Payer: Self-pay

## 2020-11-06 ENCOUNTER — Ambulatory Visit (HOSPITAL_COMMUNITY)
Admission: RE | Admit: 2020-11-06 | Discharge: 2020-11-06 | Disposition: A | Discharge status: Home / Self Care | Payer: Medicare Other | Source: Ambulatory Visit | Attending: Family | Admitting: Family

## 2020-11-06 DIAGNOSIS — N959 Unspecified menopausal and perimenopausal disorder: Secondary | ICD-10-CM | POA: Diagnosis not present

## 2020-11-13 ENCOUNTER — Other Ambulatory Visit (HOSPITAL_COMMUNITY)
Admission: RE | Admit: 2020-11-13 | Discharge: 2020-11-13 | Disposition: A | Discharge status: Home / Self Care | Payer: Medicare Other | Source: Ambulatory Visit | Attending: Oral Surgery | Admitting: Oral Surgery

## 2020-11-13 ENCOUNTER — Encounter (HOSPITAL_COMMUNITY): Payer: Self-pay | Admitting: Oral Surgery

## 2020-11-13 DIAGNOSIS — Z01812 Encounter for preprocedural laboratory examination: Secondary | ICD-10-CM | POA: Insufficient documentation

## 2020-11-13 DIAGNOSIS — Z20822 Contact with and (suspected) exposure to covid-19: Secondary | ICD-10-CM | POA: Diagnosis not present

## 2020-11-13 LAB — SARS CORONAVIRUS 2 (TAT 6-24 HRS): SARS Coronavirus 2: NEGATIVE

## 2020-11-13 NOTE — Progress Notes (Signed)
Ms Marcantel denies chest pain or shortness of breath. Patient was tested for Covid and has been in quarantine since that time.  Ms Couvillon was seen in the ED on 02/12/20 with complaints of dizziness, light headedness, "feeling like she might pass out."  Patient was given fluid, diagnosed with dehydration and instructed to follow up with her PCP.  Ms Pennella states that she has not felt that way since ED visit.  Ms Veazie was seen for OR clearance to PCP - Roseanna Rainbow, FNP at [facility] in [place]. Patient said the records were were sent to Dr. Lupita Leash office.  I called Dr. Lupita Leash office , it was closed for today.  I called the [facility] [place] office and asked for the clearance to be sent to the hospital.  Ms Bujak was on Metformin, but has been taken off, patient states that she does not have diabetes.

## 2020-11-14 ENCOUNTER — Encounter (HOSPITAL_COMMUNITY): Payer: Self-pay | Admitting: Oral Surgery

## 2020-11-14 ENCOUNTER — Encounter (HOSPITAL_COMMUNITY)
Admission: RE | Disposition: A | Discharge status: Home / Self Care | Payer: Self-pay | Source: Home / Self Care | Attending: Oral Surgery

## 2020-11-14 ENCOUNTER — Ambulatory Visit (HOSPITAL_COMMUNITY): Payer: Medicare Other | Admitting: Anesthesiology

## 2020-11-14 ENCOUNTER — Ambulatory Visit (HOSPITAL_COMMUNITY)
Admission: RE | Admit: 2020-11-14 | Discharge: 2020-11-14 | Disposition: A | Discharge status: Home / Self Care | Payer: Medicare Other | Attending: Oral Surgery | Admitting: Oral Surgery

## 2020-11-14 ENCOUNTER — Other Ambulatory Visit: Payer: Self-pay

## 2020-11-14 DIAGNOSIS — Z885 Allergy status to narcotic agent status: Secondary | ICD-10-CM | POA: Diagnosis not present

## 2020-11-14 DIAGNOSIS — M278 Other specified diseases of jaws: Secondary | ICD-10-CM | POA: Insufficient documentation

## 2020-11-14 DIAGNOSIS — K056 Periodontal disease, unspecified: Secondary | ICD-10-CM | POA: Insufficient documentation

## 2020-11-14 DIAGNOSIS — K029 Dental caries, unspecified: Secondary | ICD-10-CM | POA: Insufficient documentation

## 2020-11-14 HISTORY — DX: Depression, unspecified: F32.A

## 2020-11-14 HISTORY — DX: Personal history of urinary calculi: Z87.442

## 2020-11-14 HISTORY — DX: Unspecified osteoarthritis, unspecified site: M19.90

## 2020-11-14 HISTORY — PX: TOOTH EXTRACTION: SHX859

## 2020-11-14 LAB — GLUCOSE, CAPILLARY: Glucose-Capillary: 119 mg/dL — ABNORMAL HIGH (ref 70–99)

## 2020-11-14 SURGERY — DENTAL RESTORATION/EXTRACTIONS
Anesthesia: General

## 2020-11-14 MED ORDER — OXYCODONE-ACETAMINOPHEN 5-325 MG PO TABS: 1.0000 | ORAL_TABLET | ORAL | 0 refills | Status: DC | PRN

## 2020-11-14 MED ORDER — HYDROMORPHONE HCL 1 MG/ML IJ SOLN
0.2500 mg | INTRAMUSCULAR | Status: DC | PRN
  Administered 2020-11-14: 0.5 mg via INTRAVENOUS

## 2020-11-14 MED ORDER — LIDOCAINE-EPINEPHRINE 2 %-1:100000 IJ SOLN
INTRAMUSCULAR | Status: AC
  Filled 2020-11-14: qty 1

## 2020-11-14 MED ORDER — LACTATED RINGERS IV SOLN: INTRAVENOUS | Status: DC

## 2020-11-14 MED ORDER — 0.9 % SODIUM CHLORIDE (POUR BTL) OPTIME
TOPICAL | Status: DC | PRN
  Administered 2020-11-14: 1000 mL

## 2020-11-14 MED ORDER — FENTANYL CITRATE (PF) 250 MCG/5ML IJ SOLN
INTRAMUSCULAR | Status: AC
  Filled 2020-11-14: qty 5

## 2020-11-14 MED ORDER — DEXAMETHASONE SODIUM PHOSPHATE 10 MG/ML IJ SOLN
INTRAMUSCULAR | Status: AC
  Filled 2020-11-14: qty 1

## 2020-11-14 MED ORDER — DIPHENHYDRAMINE HCL 50 MG/ML IJ SOLN
12.5000 mg | Freq: Once | INTRAMUSCULAR | Status: AC
  Administered 2020-11-14: 12.5 mg via INTRAVENOUS

## 2020-11-14 MED ORDER — OXYCODONE HCL 5 MG PO TABS: 5.0000 mg | ORAL_TABLET | Freq: Once | ORAL | Status: DC | PRN

## 2020-11-14 MED ORDER — DEXAMETHASONE SODIUM PHOSPHATE 10 MG/ML IJ SOLN
INTRAMUSCULAR | Status: DC | PRN
  Administered 2020-11-14: 10 mg via INTRAVENOUS

## 2020-11-14 MED ORDER — FLUCONAZOLE 100 MG PO TABS: 100.0000 mg | ORAL_TABLET | Freq: Every day | ORAL | 0 refills | Status: AC

## 2020-11-14 MED ORDER — FENTANYL CITRATE (PF) 100 MCG/2ML IJ SOLN
INTRAMUSCULAR | Status: DC | PRN
  Administered 2020-11-14: 100 ug via INTRAVENOUS

## 2020-11-14 MED ORDER — PROMETHAZINE HCL 25 MG/ML IJ SOLN: 6.2500 mg | INTRAMUSCULAR | Status: DC | PRN

## 2020-11-14 MED ORDER — LIDOCAINE-EPINEPHRINE 2 %-1:100000 IJ SOLN
INTRAMUSCULAR | Status: DC | PRN
  Administered 2020-11-14: 15 mL via INTRADERMAL

## 2020-11-14 MED ORDER — CEFAZOLIN SODIUM-DEXTROSE 2-4 GM/100ML-% IV SOLN
2.0000 g | INTRAVENOUS | Status: AC
  Administered 2020-11-14: 2 g via INTRAVENOUS

## 2020-11-14 MED ORDER — PROPOFOL 10 MG/ML IV BOLUS
INTRAVENOUS | Status: DC | PRN
  Administered 2020-11-14: 150 mg via INTRAVENOUS

## 2020-11-14 MED ORDER — PROPOFOL 10 MG/ML IV BOLUS
INTRAVENOUS | Status: AC
  Filled 2020-11-14: qty 20

## 2020-11-14 MED ORDER — OXYCODONE HCL 5 MG/5ML PO SOLN: 5.0000 mg | Freq: Once | ORAL | Status: DC | PRN

## 2020-11-14 MED ORDER — ACETAMINOPHEN 500 MG PO TABS: 1000.0000 mg | ORAL_TABLET | Freq: Once | ORAL | Status: AC

## 2020-11-14 MED ORDER — KETOROLAC TROMETHAMINE 30 MG/ML IJ SOLN
INTRAMUSCULAR | Status: AC
  Filled 2020-11-14: qty 1

## 2020-11-14 MED ORDER — CHLORHEXIDINE GLUCONATE 0.12 % MT SOLN: 15.0000 mL | Freq: Once | OROMUCOSAL | Status: AC

## 2020-11-14 MED ORDER — EPHEDRINE SULFATE-NACL 50-0.9 MG/10ML-% IV SOSY
PREFILLED_SYRINGE | INTRAVENOUS | Status: DC | PRN
  Administered 2020-11-14 (×3): 10 mg via INTRAVENOUS

## 2020-11-14 MED ORDER — LIDOCAINE 2% (20 MG/ML) 5 ML SYRINGE
INTRAMUSCULAR | Status: AC
  Filled 2020-11-14: qty 5

## 2020-11-14 MED ORDER — DIPHENHYDRAMINE HCL 50 MG/ML IJ SOLN
INTRAMUSCULAR | Status: AC
  Filled 2020-11-14: qty 1

## 2020-11-14 MED ORDER — AMOXICILLIN 500 MG PO CAPS: 500.0000 mg | ORAL_CAPSULE | Freq: Three times a day (TID) | ORAL | 0 refills | Status: DC

## 2020-11-14 MED ORDER — ACETAMINOPHEN 500 MG PO TABS
ORAL_TABLET | ORAL | Status: AC
  Administered 2020-11-14: 1000 mg via ORAL
  Filled 2020-11-14: qty 2

## 2020-11-14 MED ORDER — ONDANSETRON HCL 4 MG/2ML IJ SOLN
INTRAMUSCULAR | Status: DC | PRN
  Administered 2020-11-14: 4 mg via INTRAVENOUS

## 2020-11-14 MED ORDER — ORAL CARE MOUTH RINSE: 15.0000 mL | Freq: Once | OROMUCOSAL | Status: AC

## 2020-11-14 MED ORDER — KETOROLAC TROMETHAMINE 30 MG/ML IJ SOLN
30.0000 mg | Freq: Once | INTRAMUSCULAR | Status: AC | PRN
  Administered 2020-11-14: 30 mg via INTRAVENOUS

## 2020-11-14 MED ORDER — CEFAZOLIN SODIUM-DEXTROSE 2-4 GM/100ML-% IV SOLN
INTRAVENOUS | Status: AC
  Filled 2020-11-14: qty 100

## 2020-11-14 MED ORDER — ONDANSETRON HCL 4 MG/2ML IJ SOLN
INTRAMUSCULAR | Status: AC
  Filled 2020-11-14: qty 2

## 2020-11-14 MED ORDER — HYDROMORPHONE HCL 1 MG/ML IJ SOLN
INTRAMUSCULAR | Status: AC
  Filled 2020-11-14: qty 1

## 2020-11-14 MED ORDER — MIDAZOLAM HCL 2 MG/2ML IJ SOLN
INTRAMUSCULAR | Status: AC
  Filled 2020-11-14: qty 2

## 2020-11-14 MED ORDER — SUCCINYLCHOLINE CHLORIDE 200 MG/10ML IV SOSY
PREFILLED_SYRINGE | INTRAVENOUS | Status: AC
  Filled 2020-11-14: qty 10

## 2020-11-14 MED ORDER — MIDAZOLAM HCL 5 MG/5ML IJ SOLN
INTRAMUSCULAR | Status: DC | PRN
  Administered 2020-11-14: 2 mg via INTRAVENOUS

## 2020-11-14 MED ORDER — SCOPOLAMINE 1 MG/3DAYS TD PT72: 1.0000 | MEDICATED_PATCH | TRANSDERMAL | Status: DC

## 2020-11-14 MED ORDER — SODIUM CHLORIDE 0.9 % IR SOLN
Status: DC | PRN
  Administered 2020-11-14: 1000 mL

## 2020-11-14 MED ORDER — PHENYLEPHRINE HCL (PRESSORS) 10 MG/ML IV SOLN
INTRAVENOUS | Status: DC | PRN
  Administered 2020-11-14 (×3): 80 ug via INTRAVENOUS

## 2020-11-14 MED ORDER — CHLORHEXIDINE GLUCONATE 0.12 % MT SOLN
OROMUCOSAL | Status: AC
  Administered 2020-11-14: 15 mL
  Filled 2020-11-14: qty 15

## 2020-11-14 MED ORDER — SUCCINYLCHOLINE CHLORIDE 20 MG/ML IJ SOLN
INTRAMUSCULAR | Status: DC | PRN
  Administered 2020-11-14: 100 mg via INTRAVENOUS

## 2020-11-14 MED ORDER — LIDOCAINE 2% (20 MG/ML) 5 ML SYRINGE
INTRAMUSCULAR | Status: DC | PRN
  Administered 2020-11-14: 60 mg via INTRAVENOUS

## 2020-11-14 MED ORDER — SCOPOLAMINE 1 MG/3DAYS TD PT72
MEDICATED_PATCH | TRANSDERMAL | Status: AC
  Administered 2020-11-14: 1.5 mg via TRANSDERMAL
  Filled 2020-11-14: qty 1

## 2020-11-14 MED ORDER — MEPERIDINE HCL 25 MG/ML IJ SOLN: 6.2500 mg | INTRAMUSCULAR | Status: DC | PRN

## 2020-11-14 SURGICAL SUPPLY — 35 items
BLADE SURG 15 STRL LF DISP TIS (BLADE) ×1 IMPLANT
BLADE SURG 15 STRL SS (BLADE) ×1
BUR CROSS CUT FISSURE 1.6 (BURR) ×2 IMPLANT
BUR EGG ELITE 4.0 (BURR) ×2 IMPLANT
CANISTER SUCT 3000ML PPV (MISCELLANEOUS) ×2 IMPLANT
COVER SURGICAL LIGHT HANDLE (MISCELLANEOUS) ×2 IMPLANT
COVER WAND RF STERILE (DRAPES) IMPLANT
DECANTER SPIKE VIAL GLASS SM (MISCELLANEOUS) ×2 IMPLANT
DRAPE U-SHAPE 76X120 STRL (DRAPES) ×2 IMPLANT
GAUZE PACKING FOLDED 2  STR (GAUZE/BANDAGES/DRESSINGS) ×1
GAUZE PACKING FOLDED 2 STR (GAUZE/BANDAGES/DRESSINGS) ×1 IMPLANT
GLOVE BIO SURGEON STRL SZ 6.5 (GLOVE) ×4 IMPLANT
GLOVE BIO SURGEON STRL SZ7 (GLOVE) ×4 IMPLANT
GLOVE BIO SURGEON STRL SZ8 (GLOVE) ×2 IMPLANT
GLOVE SURG UNDER POLY LF SZ6.5 (GLOVE) IMPLANT
GLOVE SURG UNDER POLY LF SZ7 (GLOVE) IMPLANT
GOWN STRL REUS W/ TWL LRG LVL3 (GOWN DISPOSABLE) ×1 IMPLANT
GOWN STRL REUS W/ TWL XL LVL3 (GOWN DISPOSABLE) ×1 IMPLANT
GOWN STRL REUS W/TWL LRG LVL3 (GOWN DISPOSABLE) ×1
GOWN STRL REUS W/TWL XL LVL3 (GOWN DISPOSABLE) ×1
IV NS 1000ML (IV SOLUTION) ×1
IV NS 1000ML BAXH (IV SOLUTION) ×1 IMPLANT
KIT BASIN OR (CUSTOM PROCEDURE TRAY) ×2 IMPLANT
KIT TURNOVER KIT B (KITS) ×2 IMPLANT
NEEDLE HYPO 25GX1X1/2 BEV (NEEDLE) ×4 IMPLANT
NS IRRIG 1000ML POUR BTL (IV SOLUTION) ×2 IMPLANT
PAD ARMBOARD 7.5X6 YLW CONV (MISCELLANEOUS) ×2 IMPLANT
SLEEVE IRRIGATION ELITE 7 (MISCELLANEOUS) ×2 IMPLANT
SPONGE SURGIFOAM ABS GEL 12-7 (HEMOSTASIS) IMPLANT
SUT CHROMIC 3 0 PS 2 (SUTURE) ×2 IMPLANT
SYR BULB IRRIG 60ML STRL (SYRINGE) ×2 IMPLANT
SYR CONTROL 10ML LL (SYRINGE) ×2 IMPLANT
TRAY ENT MC OR (CUSTOM PROCEDURE TRAY) ×2 IMPLANT
TUBING IRRIGATION (MISCELLANEOUS) ×2 IMPLANT
YANKAUER SUCT BULB TIP NO VENT (SUCTIONS) ×2 IMPLANT

## 2020-11-14 NOTE — Anesthesia Preprocedure Evaluation (Addendum)
Anesthesia Evaluation  Patient identified by MRN, date of birth, ID band Patient awake    Reviewed: Allergy & Precautions, NPO status , Patient's Chart, lab work & pertinent test results  Airway Mallampati: II  TM Distance: >3 FB Neck ROM: Full    Dental no notable dental hx. (+) Poor Dentition, Dental Advisory Given, Missing, Chipped, Partial Upper,    Pulmonary former smoker,  Quit smoking 2014, 25 pack year history  No inhalers    Pulmonary exam normal breath sounds clear to auscultation       Cardiovascular Normal cardiovascular exam(-) Valvular Problems/Murmurs Rhythm:Regular Rate:Normal     Neuro/Psych PSYCHIATRIC DISORDERS Anxiety Depression negative neurological ROS     GI/Hepatic Neg liver ROS, GERD  Controlled,  Endo/Other  Pre-diabetic, off metformin now  Renal/GU negative Renal ROS  negative genitourinary   Musculoskeletal  (+) Arthritis , Osteoarthritis,    Abdominal   Peds  Hematology negative hematology ROS (+)   Anesthesia Other Findings Dental caries  Reproductive/Obstetrics negative OB ROS S/p hysterectomy                            Anesthesia Physical Anesthesia Plan  ASA: II  Anesthesia Plan: General   Post-op Pain Management:    Induction: Intravenous  PONV Risk Score and Plan: 4 or greater and Ondansetron, Dexamethasone, Midazolam, Scopolamine patch - Pre-op and Treatment may vary due to age or medical condition  Airway Management Planned: Nasal ETT  Additional Equipment: None  Intra-op Plan:   Post-operative Plan: Extubation in OR  Informed Consent: I have reviewed the patients History and Physical, chart, labs and discussed the procedure including the risks, benefits and alternatives for the proposed anesthesia with the patient or authorized representative who has indicated his/her understanding and acceptance.     Dental advisory given  Plan  Discussed with: CRNA  Anesthesia Plan Comments:        Anesthesia Quick Evaluation

## 2020-11-14 NOTE — Anesthesia Postprocedure Evaluation (Signed)
Anesthesia Post Note  Patient: Martha [name]  Procedure(s) Performed: DENTAL RESTORATION/EXTRACTIONS (N/A )     Patient location during evaluation: PACU Anesthesia Type: General Level of consciousness: awake and alert, oriented and patient cooperative Pain management: pain level controlled Vital Signs Assessment: post-procedure vital signs reviewed and stable Respiratory status: spontaneous breathing, nonlabored ventilation and respiratory function stable Cardiovascular status: blood pressure returned to baseline and stable Postop Assessment: no apparent nausea or vomiting Anesthetic complications: no   No complications documented.  Last Vitals:  Vitals:   11/14/20 1300 11/14/20 1315  BP: (!) 149/92 (!) 167/88  Pulse: 82 80  Resp: 18 16  Temp:    SpO2: 96% 98%    Last Pain:  Vitals:   11/14/20 1246  TempSrc:   PainSc: [place]

## 2020-11-14 NOTE — H&P (Signed)
HISTORY AND PHYSICAL  Martha [name] is a 55 y.o. female patient with CC: Painful teeth  No diagnosis found.  Past Medical History:  Diagnosis Date  . Abdominal pain in female 11/27/2015  . ADHD   . Arthritis   . Body aches 11/27/2015  . Current use of estrogen therapy 11/27/2015  . Depression   . GERD (gastroesophageal reflux disease)   . Heart murmur   . Hematuria 11/27/2015  . History of depression   . History of kidney stones    "must still be there."  . Hot flashes due to surgical menopause 11/27/2015  . Hyperlipemia 11/27/2015  . Hyperlipidemia   . Mental disorder    history of depression  . Moody 11/27/2015  . Nausea 11/27/2015  . Neck pain   . PTSD (post-traumatic stress disorder)   . PTSD (post-traumatic stress disorder)   . Vaginal itching 12/11/2015    Current Facility-Administered Medications  Medication Dose Route Frequency Provider Last Rate Last Admin  . ceFAZolin (ANCEF) 2-4 GM/100ML-% IVPB           . ceFAZolin (ANCEF) IVPB 2g/100 mL premix  2 g Intravenous On Call to [place], DMD      . lactated ringers infusion   Intravenous Continuous Pervis Hocking, DO 10 mL/hr at 11/14/20 0919 New Bag at 11/14/20 0919  . scopolamine (TRANSDERM-SCOP) 1 MG/3DAYS 1.5 mg  1 patch Transdermal Q72H Pervis Hocking, DO   1.5 mg at 11/14/20 0910   Allergies  Allergen Reactions  . Morphine And Related Shortness Of Breath and Itching    Patient can  tolerate if given Benadryl with medication,   . Codeine Hives   Active Problems:   * No active hospital problems. *  Vitals: Blood pressure (!) 146/81, pulse 72, temperature (!) 97.3 F (36.3 C), temperature source Oral, resp. rate 18, height 5\' 5"  (1.651 m), weight 72.6 kg, SpO2 98 %. Lab results: Results for orders placed or performed during the hospital encounter of 11/14/20 (from the past 24 hour(s))  Glucose, capillary     Status: Abnormal   Collection Time: 11/14/20  8:44 AM  Result Value Ref Range    Glucose-Capillary 119 (H) 70 - 99 mg/dL   Radiology Results: No results found. General appearance: alert, cooperative and no distress Head: Normocephalic, without obvious abnormality, atraumatic Eyes: negative Nose: Nares normal. Septum midline. Mucosa normal. No drainage or sinus tenderness. Throat: Multiple carious teeth. Maxillary left buccal exostosis. Pharynx clear. Neck: no adenopathy and supple, symmetrical, trachea midline Resp: clear to auscultation bilaterally Cardio: regular rate and rhythm, S1, S2 normal, no murmur, click, rub or gallop  Assessment: Multiple carious teeth. Maxillary left buccal exostosis. Plan: Dental extractions, removal exostosis, alveoloplasty. GA. Day surgery   Diona Browner 11/14/2020

## 2020-11-14 NOTE — Anesthesia Procedure Notes (Signed)
Procedure Name: Intubation Date/Time: 11/14/2020 11:12 AM Performed by: Hoy Morn, CRNA Pre-anesthesia Checklist: Patient identified, Emergency Drugs available, Suction available and Patient being monitored Patient Re-evaluated:Patient Re-evaluated prior to induction Oxygen Delivery Method: Circle system utilized Preoxygenation: Pre-oxygenation with 100% oxygen Induction Type: IV induction Ventilation: Mask ventilation without difficulty Laryngoscope Size: Miller and 2 Grade View: Grade I Nasal Tubes: Nasal prep performed, Nasal Rae and Right Number of attempts: 1 Placement Confirmation: ETT inserted through vocal cords under direct vision,  positive ETCO2 and breath sounds checked- equal and bilateral Secured at: 27 cm Tube secured with: Tape Dental Injury: Teeth and Oropharynx as per pre-operative assessment

## 2020-11-14 NOTE — Op Note (Signed)
11/14/2020  12:02 PM  PATIENT:  Martha [name]  55 y.o. 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   PROCEDURE DETAILS: Dictation #[id number]  Gae Bon, DMD 11/14/2020 12:02 PM

## 2020-11-14 NOTE — Transfer of Care (Signed)
Immediate Anesthesia Transfer of Care Note  Patient: Martha [name]  Procedure(s) Performed: DENTAL RESTORATION/EXTRACTIONS (N/A )  Patient Location: PACU  Anesthesia Type:General  Level of Consciousness: drowsy  Airway & Oxygen Therapy: Patient Spontanous Breathing and Patient connected to face mask oxygen  Post-op Assessment: Report given to RN and Post -op Vital signs reviewed and stable  Post vital signs: Reviewed and stable  Last Vitals:  Vitals Value Taken Time  BP 145/98 11/14/20 1216  Temp    Pulse 92 11/14/20 1217  Resp 17 11/14/20 1217  SpO2 95 % 11/14/20 1217  Vitals shown include unvalidated device data.  Last Pain:  Vitals:   11/14/20 0857  TempSrc:   PainSc: 9       Patients Stated Pain Goal: 9 (50/56/97 9480)  Complications: No complications documented.

## 2020-11-15 ENCOUNTER — Encounter (HOSPITAL_COMMUNITY): Payer: Self-pay | Admitting: Oral Surgery

## 2020-11-15 NOTE — Op Note (Signed)
NAME: Martha [name], DULL MEDICAL RECORD NO: [id number] ACCOUNT NO: [id number] DATE OF BIRTH: 19-Sep-1965 FACILITY: [facility] LOCATION: [facility] PHYSICIAN: Gae Bon, DDS  Operative Report   DATE OF PROCEDURE: 11/14/2020   PREOPERATIVE DIAGNOSIS:  Nonrestorable teeth secondary to dental caries and periodontal disease 6, 7, 8, 9, 21, 22, 23, 24, 25, 26, 27, 28, 29, left maxillary buccal exostosis.  POSTOPERATIVE DIAGNOSES:  Nonrestorable teeth secondary to dental caries and periodontal disease 6, 7, 8, 9, 21, 22, 23, 24, 25, 26, 27, 28, 29, left maxillary buccal exostosis.  PROCEDURE:  Extraction of teeth #6, 7, 8, 9, 21, 22, 23, 24, 25, 26, 27, 28, 29, alveoplasty right and left maxilla and mandible, removal of exostosis, left maxilla.  SURGEON:  Dr. Diona Browner.  ANESTHESIA:  General, nasal intubation.   Dr. Barnie Mort  attending.  DESCRIPTION OF PROCEDURE:  The patient was taken to the operating room and placed on the table in supine position.  General anesthesia was administered intravenously.  Nasal endotracheal tube was placed and secured.  The eyes were protected and the  patient was draped for surgery.  Timeout was performed.  The posterior pharynx was suctioned and a throat pack was placed.  2% lidocaine 1:100,000 epinephrine was infiltrated in an inferior alveolar block on the right and left sides and buccal and  palatal infiltration of the maxilla from tooth # 6 to 13.  A bite block was placed on the right side of the mouth.  A sweetheart retractor was used to retract the tongue.  A #15 blade used to make an incision 1 cm proximal to tooth #21 on the alveolar  crest and it was split into a buccal and lingual incision, which went from tooth #21 to tooth #26.  The periosteum was reflected from around these teeth.  The teeth were elevated with a 301 elevator and then the teeth were removed with the Ash forceps.   When tooth #22 was removed the buccal bone was thin and was ankylosed to the  buccal surface of the root of tooth #22.  The sockets were then curetted and then the periosteum was reflected to expose the alveolar crest, which was irregular in contour,  owing to severe periodontal disease and the loss of the buccal bone at tooth #22.  Alveoplasty was performed using the egg-shaped burr and then followed by the bone filed and the area was irrigated and closed with 3-0 chromic.  Then, a 15 blade was used  to make an incision 1 cm proximal to tooth #13 carried forward along the alveolar crest to tooth #9 and then this incision was created buccally and palatally around teeth #9, 8, 7 and 6.  The periosteum was reflected.  The teeth were elevated with a 301  elevator and removed from the mouth with a forceps.  The buccal bone in the area of tooth #6 was very thin and was ankylosed to the root of the tooth.  Then, the tissue was reflected to expose the alveolar crest.  There was an exostosis in the area of  #13.  This was reduced and removed with the egg bur and then alveoplasty performed around teeth numbers 6, 7, 8 and 9.  Using the alveolar burr and a Stryker handpiece under irrigation, the areas were smoothed with a bone file and closed with 3-0  chromic.  The sweetheart retractor and bite block were repositioned to the other side of the mouth.  Then, teeth #27, 28 and 29 were removed.  This was done using the 15 blade with a similar incision as before, the teeth were elevated with 301 elevator  and removed with dental forceps.  The bone in the canine region 27 was thin buccally and fractured upon removal of tooth #27.  The tissue was reflected to expose the irregular bone and then alveoplasty was performed using the egg bur followed by the bone  file.  This area was then irrigated and closed with 3-0 chromic.  Then, the oral cavity was irrigated and suctioned.  The throat pack was removed.  The patient was placed under the care of anesthesia for transport to recovery room with plans for   discharge home through day surgery.  ESTIMATED BLOOD LOSS:  Minimum.  COMPLICATIONS:  None.  SPECIMENS:  None.   SUJ D: 11/14/2020 12:08:26 pm T: 11/15/2020 2:37:00 am  JOB: [id number]/ [id number]

## 2020-12-01 ENCOUNTER — Emergency Department (HOSPITAL_COMMUNITY)
Admission: EM | Admit: 2020-12-01 | Discharge: 2020-12-02 | Disposition: A | Discharge status: Home / Self Care | Payer: Medicare Other | Attending: Emergency Medicine | Admitting: Emergency Medicine

## 2020-12-01 ENCOUNTER — Other Ambulatory Visit: Payer: Self-pay

## 2020-12-01 ENCOUNTER — Encounter (HOSPITAL_COMMUNITY): Payer: Self-pay | Admitting: Emergency Medicine

## 2020-12-01 DIAGNOSIS — Z87891 Personal history of nicotine dependence: Secondary | ICD-10-CM | POA: Diagnosis not present

## 2020-12-01 DIAGNOSIS — R0789 Other chest pain: Secondary | ICD-10-CM | POA: Diagnosis not present

## 2020-12-01 NOTE — ED Triage Notes (Signed)
Pt c/o chest pain that radiates to back x a couple of days.

## 2020-12-02 ENCOUNTER — Emergency Department (HOSPITAL_COMMUNITY): Payer: Medicare Other

## 2020-12-02 ENCOUNTER — Encounter (HOSPITAL_COMMUNITY): Payer: Self-pay | Admitting: Radiology

## 2020-12-02 DIAGNOSIS — R0789 Other chest pain: Secondary | ICD-10-CM | POA: Diagnosis not present

## 2020-12-02 LAB — BASIC METABOLIC PANEL
Anion gap: 9 (ref 5–15)
BUN: 22 mg/dL — ABNORMAL HIGH (ref 6–20)
CO2: 26 mmol/L (ref 22–32)
Calcium: 9.3 mg/dL (ref 8.9–10.3)
Chloride: 103 mmol/L (ref 98–111)
Creatinine, Ser: 0.65 mg/dL (ref 0.44–1.00)
GFR, Estimated: >60 mL/min (ref >60)
Glucose, Bld: 87 mg/dL (ref 70–99)
Potassium: 3.6 mmol/L (ref 3.5–5.1)
Sodium: 138 mmol/L (ref 135–145)

## 2020-12-02 LAB — CBC
HCT: 39.4 % (ref 36.0–46.0)
Hemoglobin: 13.4 g/dL (ref 12.0–15.0)
MCH: 31.7 pg (ref 26.0–34.0)
MCHC: 34 g/dL (ref 30.0–36.0)
MCV: 93.1 fL (ref 80.0–100.0)
Platelets: 205 10*3/uL (ref 150–400)
RBC: 4.23 MIL/uL (ref 3.87–5.11)
RDW: 12.1 % (ref 11.5–15.5)
WBC: 8 10*3/uL (ref 4.0–10.5)
nRBC: 0 % (ref 0.0–0.2)

## 2020-12-02 LAB — HEPATIC FUNCTION PANEL
ALT: 19 U/L (ref 0–44)
AST: 22 U/L (ref 15–41)
Albumin: 3.8 g/dL (ref 3.5–5.0)
Alkaline Phosphatase: 51 U/L (ref 38–126)
Bilirubin, Direct: 0.1 mg/dL (ref 0.0–0.2)
Indirect Bilirubin: 0.7 mg/dL (ref 0.3–0.9)
Total Bilirubin: 0.8 mg/dL (ref 0.3–1.2)
Total Protein: 6.6 g/dL (ref 6.5–8.1)

## 2020-12-02 LAB — LIPASE, BLOOD: Lipase: 38 U/L (ref 11–51)

## 2020-12-02 LAB — TROPONIN I (HIGH SENSITIVITY)
Troponin I (High Sensitivity): 3 ng/L (ref <18)
Troponin I (High Sensitivity): 3 ng/L (ref <18)

## 2020-12-02 LAB — D-DIMER, QUANTITATIVE: D-Dimer, Quant: 0.71 ug/mL-FEU — ABNORMAL HIGH (ref 0.00–0.50)

## 2020-12-02 MED ORDER — HYDROCODONE-ACETAMINOPHEN 5-325 MG PO TABS: 1.0000 | ORAL_TABLET | ORAL | 0 refills | Status: DC | PRN

## 2020-12-02 MED ORDER — KETOROLAC TROMETHAMINE 30 MG/ML IJ SOLN
30.0000 mg | Freq: Once | INTRAMUSCULAR | Status: AC
  Administered 2020-12-02: 30 mg via INTRAVENOUS
  Filled 2020-12-02: qty 1

## 2020-12-02 MED ORDER — IOHEXOL 350 MG/ML SOLN
100.0000 mL | Freq: Once | INTRAVENOUS | Status: AC | PRN
  Administered 2020-12-02: 100 mL via INTRAVENOUS

## 2020-12-02 MED ORDER — ONDANSETRON HCL 4 MG/2ML IJ SOLN
4.0000 mg | Freq: Once | INTRAMUSCULAR | Status: AC
  Administered 2020-12-02: 4 mg via INTRAVENOUS
  Filled 2020-12-02: qty 2

## 2020-12-02 MED ORDER — FENTANYL CITRATE (PF) 100 MCG/2ML IJ SOLN
100.0000 ug | Freq: Once | INTRAMUSCULAR | Status: AC
  Administered 2020-12-02: 100 ug via INTRAVENOUS
  Filled 2020-12-02: qty 2

## 2020-12-02 MED ORDER — PREDNISONE 10 MG (21) PO TBPK: ORAL_TABLET | ORAL | 0 refills | Status: DC

## 2020-12-02 MED ORDER — SODIUM CHLORIDE 0.9 % IV BOLUS
1000.0000 mL | Freq: Once | INTRAVENOUS | Status: AC
Start: 2020-12-02 — End: 2020-12-02
  Administered 2020-12-02: 1000 mL via INTRAVENOUS

## 2020-12-02 NOTE — ED Provider Notes (Signed)
[facility] EMERGENCY DEPARTMENT Provider Note   CSN: [id number] Arrival date & time: 12/01/20  2137     History Chief Complaint  Patient presents with  . Chest Pain    Martha [name] is a 55 y.o. female.  Pt presents to the ED today with CP.  The pt said it has been going on for several days.  She said it is worse when she lays back.  It hurts with deep breaths.  It radiates to her back.  She denies cough or f/c.  No leg swelling.        Past Medical History:  Diagnosis Date  . Abdominal pain in female 11/27/2015  . ADHD   . Arthritis   . Body aches 11/27/2015  . Current use of estrogen therapy 11/27/2015  . Depression   . GERD (gastroesophageal reflux disease)   . Heart murmur   . Hematuria 11/27/2015  . History of depression   . History of kidney stones    "must still be there."  . Hot flashes due to surgical menopause 11/27/2015  . Hyperlipemia 11/27/2015  . Hyperlipidemia   . Mental disorder    history of depression  . Moody 11/27/2015  . Nausea 11/27/2015  . Neck pain   . PTSD (post-traumatic stress disorder)   . PTSD (post-traumatic stress disorder)   . Vaginal itching 12/11/2015    Patient Active Problem List   Diagnosis Date Noted  . Vaginal itching 12/11/2015  . Hyperlipemia 11/27/2015  . Hot flashes due to surgical menopause 11/27/2015  . Abdominal pain in female 11/27/2015  . Nausea 11/27/2015  . Pelvic pressure in female 11/27/2015  . Hematuria 11/27/2015  . Body aches 11/27/2015  . Moody 11/27/2015  . Current use of estrogen therapy 11/27/2015  . Arm pain 11/11/2011  . Other specified disorder of skin 11/11/2011    Past Surgical History:  Procedure Laterality Date  . ABDOMINAL HYSTERECTOMY    . APPENDECTOMY    . BACK SURGERY    . COLON SURGERY    . LUMBAR FUSION     L5-S1  . NECK SURGERY    . TOOTH EXTRACTION N/A 11/14/2020   Procedure: DENTAL RESTORATION/EXTRACTIONS;  Surgeon: Diona Browner, DMD;  Location: [facility];  Service: Oral Surgery;   Laterality: N/A;  . WRIST SURGERY     cyst removal     OB History    Gravida  3   Para  2   Term      Preterm      AB  1   Living  2     SAB  1   IAB      Ectopic      Multiple      Live Births              Family History  Problem Relation Age of Onset  . Cancer Mother        bone  . Emphysema Father   . Cancer Other     Social History   Tobacco Use  . Smoking status: Former Smoker    Packs/day: 1.00    Years: 25.00    Pack years: 25.00    Types: Cigarettes    Quit date: 03/11/2013    Years since quitting: 7.7  . Smokeless tobacco: Never Used  . Tobacco comment: vapes  Vaping Use  . Vaping Use: Former  . Quit date: 11/26/2020  Substance Use Topics  . Alcohol use: No  . Drug use:  No    Home Medications Prior to Admission medications   Medication Sig Start Date End Date Taking? Authorizing Provider  amphetamine-dextroamphetamine (ADDERALL XR) 30 MG 24 hr capsule Take 30 mg by mouth every morning. 10/24/20  Yes [provider]  amphetamine-dextroamphetamine (ADDERALL) 10 MG tablet Take 10 mg by mouth daily. 10/24/20  Yes [provider]  buPROPion (WELLBUTRIN XL) 150 MG 24 hr tablet Take 150 mg by mouth every morning. 10/22/20  Yes [provider]  Calcium Citrate-Vitamin D (CITRACAL + D PO) Take 600 mg by mouth daily.   Yes [provider]  estradiol (ESTRACE) 1 MG tablet Take 1 tablet (1 mg total) by mouth daily. 06/03/16  Yes Estill Dooms, NP  HYDROcodone-acetaminophen (NORCO/VICODIN) 5-325 MG tablet Take 1 tablet by mouth every 4 (four) hours as needed. 12/02/20  Yes Isla Pence, MD  lamoTRIgine (LAMICTAL) 25 MG tablet Take 25 mg by mouth 2 (two) times daily. 10/27/20  Yes [provider]  Multiple Vitamins-Minerals (MULTIVITAMIN WITH MINERALS) tablet Take 1 tablet by mouth daily.   Yes [provider]  oxybutynin (DITROPAN-XL) 5 MG 24 hr tablet Take 5 mg by mouth daily. 09/23/20  Yes  [provider]  predniSONE (STERAPRED UNI-PAK 21 TAB) 10 MG (21) TBPK tablet Take 6 tabs for 2 days, then 5 for 2 days, then 4 for 2 days, then 3 for 2 days, 2 for 2 days, then 1 for 2 days 12/02/20  Yes Isla Pence, MD  rOPINIRole (REQUIP) 0.25 MG tablet Take 0.25-0.5 mg by mouth at bedtime as needed and may repeat dose one time if needed (restless leg syndrome). 09/23/20  Yes [provider]  rosuvastatin (CRESTOR) 20 MG tablet Take 20 mg by mouth daily.   Yes [provider]  sertraline (ZOLOFT) 50 MG tablet Take 1 tablet by mouth daily. 09/26/20  Yes [provider]  amoxicillin (AMOXIL) 500 MG capsule Take 1 capsule (500 mg total) by mouth 3 (three) times daily. Patient not taking: Reported on 12/02/2020 11/14/20   Diona Browner, DMD  oxyCODONE-acetaminophen (PERCOCET) 5-325 MG tablet Take 1 tablet by mouth every 4 (four) hours as needed. Patient not taking: No sig reported 11/14/20   Diona Browner, DMD    Allergies    Morphine and related and Codeine  Review of Systems   Review of Systems  Cardiovascular: Positive for chest pain.  All other systems reviewed and are negative.   Physical Exam Updated Vital Signs BP (!) 119/97   Pulse 78   Temp 97.7 F (36.5 C) (Oral)   Resp (!) 26   Ht 5\' 5"  (1.651 m)   Wt 78.8 kg   SpO2 94%   BMI 28.91 kg/m   Physical Exam Vitals and nursing note reviewed.  Constitutional:      Appearance: She is well-developed.  HENT:     Head: Normocephalic and atraumatic.  Eyes:     Extraocular Movements: Extraocular movements intact.     Pupils: Pupils are equal, round, and reactive to light.  Cardiovascular:     Rate and Rhythm: Normal rate and regular rhythm.     Heart sounds: Normal heart sounds.  Pulmonary:     Effort: Pulmonary effort is normal.     Breath sounds: Normal breath sounds.  Chest:    Abdominal:     General: Bowel sounds are normal.     Palpations: Abdomen is soft.  Musculoskeletal:         General: Normal range of  motion.     Cervical back: Normal range of motion and neck supple.  Skin:    General: Skin is warm and dry.     Capillary Refill: Capillary refill takes less than 2 seconds.  Neurological:     General: No focal deficit present.     Mental Status: She is alert and oriented to person, place, and time.  Psychiatric:        Mood and Affect: Mood normal.        Behavior: Behavior normal.     ED Results / Procedures / Treatments   Labs (all labs ordered are listed, but only abnormal results are displayed) Labs Reviewed  BASIC METABOLIC PANEL - Abnormal; Notable for the following components:      Result Value   BUN 22 (*)    All other components within normal limits  D-DIMER, QUANTITATIVE - Abnormal; Notable for the following components:   D-Dimer, Quant 0.71 (*)    All other components within normal limits  CBC  HEPATIC FUNCTION PANEL  LIPASE, BLOOD  TROPONIN I (HIGH SENSITIVITY)  TROPONIN I (HIGH SENSITIVITY)    EKG EKG Interpretation  Date/Time:  Monday December 01 2020 21:52:53 EDT Ventricular Rate:  72 PR Interval:  155 QRS Duration: 95 QT Interval:  394 QTC Calculation: 432 R Axis:   80 Text Interpretation: Sinus rhythm Normal ECG No significant change was found Confirmed by Shanon Rosser [phone]) on 12/01/2020 10:52:39 PM   Radiology DG Chest 2 View  Result Date: 12/02/2020 CLINICAL DATA:  Chest pain. EXAM: CHEST - 2 VIEW COMPARISON:  07/01/2011. FINDINGS: Mediastinum hilar structures normal. Mild bilateral perihilar interstitial prominence. Mild pneumonitis cannot be excluded. No pleural effusion or pneumothorax. Heart size stable. Prior cervical spine fusion. IMPRESSION: Mild bilateral perihilar interstitial prominence. Mild pneumonitis cannot be excluded. Electronically Signed   By: Marcello Moores  Register   On: 12/02/2020 07:24   CT Angio Chest PE W and/or Wo Contrast  Result Date: 12/02/2020 CLINICAL DATA:  55 year old female with increasing  chest pain radiating to the back for 2 days. EXAM: CT ANGIOGRAPHY CHEST WITH CONTRAST TECHNIQUE: Multidetector CT imaging of the chest was performed using the standard protocol during bolus administration of intravenous contrast. Multiplanar CT image reconstructions and MIPs were obtained to evaluate the vascular anatomy. CONTRAST:  80 mL OMNIPAQUE IOHEXOL 350 MG/ML SOLN COMPARISON:  Chest radiographs earlier today. CT Abdomen and Pelvis 07/11/2012. FINDINGS: Cardiovascular: Good contrast bolus timing in the pulmonary arterial tree. Mild respiratory motion. No focal filling defect identified in the pulmonary arteries to suggest acute pulmonary embolism. Calcified coronary artery atherosclerosis (series 5, image 166). Cardiac size at the upper limits of normal. No pericardial effusion. Negative visible aorta. Mediastinum/Nodes: Negative.  No lymphadenopathy. Lungs/Pleura: Lower lung volumes compared to 2013. Major airways remain patent. Mild atelectasis in both lungs including dependent ground-glass opacity. Otherwise negative. No pleural effusion. Upper Abdomen: Negative visible liver, spleen, adrenal glands, kidneys and stomach. Musculoskeletal: Partially visible lower cervical ACDF. Thoracic spine appears normal for age. No acute osseous abnormality identified. Review of the MIP images confirms the above findings. IMPRESSION: 1. Negative for acute pulmonary embolus. 2. Calcified coronary artery atherosclerosis. 3. Mild pulmonary atelectasis. Electronically Signed   By: Genevie Ann M.D.   On: 12/02/2020 10:40    Procedures Procedures   Medications Ordered in ED Medications  sodium chloride 0.9 % bolus 1,000 mL (0 mLs Intravenous Stopped 12/02/20 1000)  ondansetron (ZOFRAN) injection 4 mg (4 mg Intravenous Given 12/02/20 0750)  ketorolac (  TORADOL) 30 MG/ML injection 30 mg (30 mg Intravenous Given 12/02/20 0751)  fentaNYL (SUBLIMAZE) injection 100 mcg (100 mcg Intravenous Given 12/02/20 0956)  iohexol (OMNIPAQUE)  350 MG/ML injection 100 mL (100 mLs Intravenous Contrast Given 12/02/20 1016)    ED Course  I have reviewed the triage vital signs and the nursing notes.  Pertinent labs & imaging results that were available during my care of the patient were reviewed by me and considered in my medical decision making (see chart for details).    MDM Rules/Calculators/A&P                          Cardiac work up negative.  She had a negative ct for PE.  She is feeling much better.  She is stable for d/c.  Return if worse. Final Clinical Impression(s) / ED Diagnoses Final diagnoses:  Atypical chest pain    Rx / DC Orders ED Discharge Orders         Ordered    predniSONE (STERAPRED UNI-PAK 21 TAB) 10 MG (21) TBPK tablet        12/02/20 1047    HYDROcodone-acetaminophen (NORCO/VICODIN) 5-325 MG tablet  Every 4 hours PRN        12/02/20 1047           Isla Pence, MD 12/02/20 1202

## 2020-12-02 NOTE — ED Notes (Signed)
Pt disconnected herself from monitor, unable to obtain vitals at this time.

## 2020-12-02 NOTE — ED Notes (Signed)
Pt came to door stating that she wanted her IV out and wanted to leave. After NT d/c'd IV, pt then stated she was in too much pain to leave at this time.

## 2020-12-05 DIAGNOSIS — T819XXA Unspecified complication of procedure, initial encounter: Secondary | ICD-10-CM | POA: Insufficient documentation

## 2020-12-05 DIAGNOSIS — Z981 Arthrodesis status: Secondary | ICD-10-CM | POA: Insufficient documentation

## 2020-12-11 LAB — LIPID PANEL
Cholesterol: 196 (ref 0–200)
HDL: 79 — AB (ref 35–70)
LDL Cholesterol: 87
Triglycerides: 179 — AB (ref 40–160)

## 2020-12-11 LAB — HEPATIC FUNCTION PANEL
ALT: 15 (ref 7–35)
AST: 17 (ref 13–35)
Alkaline Phosphatase: 56 (ref 25–125)
Bilirubin, Total: 0.4

## 2020-12-11 LAB — CBC AND DIFFERENTIAL
HCT: 41 (ref 36–46)
Hemoglobin: 13.9 (ref 12.0–16.0)
Platelets: 221 (ref 150–399)
WBC: 11.7

## 2020-12-11 LAB — HEMOGLOBIN A1C: Hemoglobin A1C: 6.2

## 2020-12-11 LAB — BASIC METABOLIC PANEL
BUN: 15 (ref 4–21)
CO2: 25 — AB (ref 13–22)
Chloride: 98 — AB (ref 99–108)
Creatinine: 0.8 (ref 0.5–1.1)
Glucose: 81
Potassium: 4.5 (ref 3.4–5.3)
Sodium: 140 (ref 137–147)

## 2020-12-11 LAB — VITAMIN D 25 HYDROXY (VIT D DEFICIENCY, FRACTURES): Vit D, 25-Hydroxy: 44.8

## 2020-12-11 LAB — COMPREHENSIVE METABOLIC PANEL
Albumin: 4.3 (ref 3.5–5.0)
Calcium: 9.3 (ref 8.7–10.7)
GFR calc non Af Amer: 90
Globulin: 2.3

## 2020-12-11 LAB — CBC: RBC: 4.43 (ref 3.87–5.11)

## 2020-12-19 ENCOUNTER — Encounter: Payer: Self-pay | Admitting: Nurse Practitioner

## 2020-12-25 LAB — ANA: ANA Direct: NEGATIVE

## 2020-12-25 LAB — C-REACTIVE PROTEIN: CRP: 1

## 2020-12-25 LAB — SEDIMENTATION RATE: SED RATE BY MODIFIED WESTERGREN,MANUAL: 2

## 2021-01-12 ENCOUNTER — Other Ambulatory Visit: Payer: Self-pay

## 2021-01-13 ENCOUNTER — Ambulatory Visit: Payer: Medicare Other | Admitting: Nurse Practitioner

## 2021-02-09 NOTE — Progress Notes (Signed)
02/10/2021 Martha [name] [id number] 11-Jun-1966   CHIEF COMPLAINT: Acid reflux, gas, constipation   HISTORY OF PRESENT ILLNESS: Martha [name] is a 55 year old female with a past medical history of ADHD, depression, arthritis, hyperlipidemia and GERD.  She presents to our office today as referred by Roseanna Rainbow NP for further evaluation regarding GERD symptoms and constipation. She reports having an intermittent history of GERD.  She underwent an EGD approximately 15 years ago at [facility] by Dr. Gala Romney which she reported showed acid reflux and a few ulcers.  She was on Nexium for a few years but has not taken any prescription acid reducing medication for the past year or 2.  She complains of having worse acid reflux for the past month.  She sometimes drinks milk to reduce this discomfort.  No dysphagia.  She has vague intermittent upper abdominal pain.  No NSAID use.  She developed nighttime fecal incontinence, loose stools 4 to 5 months ago which correlated when taking Metformin.  Since then, Metformin was discontinued without any further episodes of diarrhea or nighttime fecal incontinence.  She underwent oral surgery 11/2020 and received oral steroids which resulted in constipation for a few weeks which resolved.  She typically passes 3 soft brown bowel movements daily.  No rectal bleeding or black stools.  She underwent a colectomy and 20% of her small intestine were removed 20 years ago at [facility] in [place]. She stated "my colon quit working".  No bowel obstruction. She reported her mother gave her Epson salt daily as a child which resulted in bowel damage.  Her colon surgery records are not in [place]. She reported undergoing 1 or 2 colonoscopies prior to her colon surgery without significant findings.  No history of Hirschsprung's disease. 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 No known family history of IBD or colorectal cancer.  She reports having prolonged  sedation after 2 of her past surgeries (she cannot recall which surgeries) further details are unknown.  No difficulties with airway management or intubation.   CBC Latest Ref Rng & Units 12/11/2020 12/02/2020 02/12/2020  WBC - 11.7 8.0 5.7  Hemoglobin 12.0 - 16.0 13.9 13.4 14.0  Hematocrit 36 - 46 41 39.4 43.2  Platelets 150 - 399 221 205 170    CMP Latest Ref Rng & Units 12/11/2020 12/02/2020 02/12/2020  Glucose 70 - 99 mg/dL - 87 111(H)  BUN 4 - 21 15 22(H) 18  Creatinine 0.5 - 1.1 0.8 0.65 0.69  Sodium 137 - 147 140 138 141  Potassium 3.4 - 5.3 4.5 3.6 4.4  Chloride 99 - 108 98(A) 103 101  CO2 13 - 22 25(A) 26 30  Calcium 8.7 - 10.7 9.3 9.3 9.7  Total Protein 6.5 - 8.1 g/dL - 6.6 -  Total Bilirubin 0.3 - 1.2 mg/dL - 0.8 -  Alkaline Phos 25 - 125 56 51 -  AST 13 - 35 17 22 -  ALT 7 - 35 15 19 -    Past Medical History:  Diagnosis Date   Abdominal pain in female 11/27/2015   ADHD    Anxiety    Arthritis    Body aches 11/27/2015   Current use of estrogen therapy 11/27/2015   Depression    GERD (gastroesophageal reflux disease)    Heart murmur    Hematuria 11/27/2015   History of depression    History of kidney stones    "must still be there."  Hot flashes due to surgical menopause 11/27/2015   Hyperlipemia 11/27/2015   Hyperlipidemia    Mental disorder    history of depression   Moody 11/27/2015   Nausea 11/27/2015   Neck pain    PTSD (post-traumatic stress disorder)    PTSD (post-traumatic stress disorder)    Vaginal itching 12/11/2015   Past Surgical History:  Procedure Laterality Date   ABDOMINAL HYSTERECTOMY     APPENDECTOMY     BACK SURGERY     CARPAL TUNNEL RELEASE Right    COLON SURGERY     LUMBAR FUSION     L5-S1   NECK SURGERY     x 2   TOOTH EXTRACTION N/A 11/14/2020   Procedure: DENTAL RESTORATION/EXTRACTIONS;  Surgeon: Diona Browner, DMD;  Location: [facility];  Service: Oral Surgery;  Laterality: N/A;   WRIST SURGERY Left    cyst removal   Social  History: Separated x 2 years. She has 2 grown children. She smoked cigarettes 1ppd or less for 25 years, quit smoking about 8 years ago. No alcohol or drug use.   Family History: Mother died from lung cancer. Father deceased from emphysema. No family history of esophagea, gastric or colon cancer.    Allergies  Allergen Reactions   Morphine And Related Shortness Of Breath and Itching    Patient can  tolerate if given Benadryl with medication,    Codeine Hives     Outpatient Encounter Medications as of 02/10/2021  Medication Sig   amphetamine-dextroamphetamine (ADDERALL XR) 30 MG 24 hr capsule Take 30 mg by mouth every morning.   amphetamine-dextroamphetamine (ADDERALL) 10 MG tablet Take 10 mg by mouth daily.   buPROPion (WELLBUTRIN XL) 150 MG 24 hr tablet Take 150 mg by mouth every morning.   Calcium Citrate-Vitamin D (CITRACAL + D PO) Take 600 mg by mouth daily.   clindamycin (CLEOCIN T) 1 % external solution clindamycin phosphate 1 % topical solution   diazepam (VALIUM) 2 MG tablet diazepam 2 mg tablet  TAKE 1 TABLET BY MOUTH THREE TIMES DAILY AS NEEDED FOR ANXIETY   estradiol (ESTRACE) 1 MG tablet Take 1 tablet (1 mg total) by mouth daily.   lamoTRIgine (LAMICTAL) 25 MG tablet Take 25 mg by mouth 2 (two) times daily.   lidocaine (XYLOCAINE) 2 % solution Lidocaine Viscous 2 % mucosal solution   Multiple Vitamins-Minerals (MULTIVITAMIN WITH MINERALS) tablet Take 1 tablet by mouth daily.   oxybutynin (DITROPAN-XL) 5 MG 24 hr tablet Take 5 mg by mouth daily.   rOPINIRole (REQUIP) 0.25 MG tablet Take 0.25-0.5 mg by mouth at bedtime as needed and may repeat dose one time if needed (restless leg syndrome).   rosuvastatin (CRESTOR) 20 MG tablet Take 20 mg by mouth daily.   sertraline (ZOLOFT) 50 MG tablet Take 1 tablet by mouth daily.   [DISCONTINUED] amoxicillin (AMOXIL) 500 MG capsule Take 1 capsule (500 mg total) by mouth 3 (three) times daily. (Patient not taking: Reported on 12/02/2020)    [DISCONTINUED] fluconazole (DIFLUCAN) 100 MG tablet fluconazole 100 mg tablet  TAKE 1 TABLET BY MOUTH ONCE DAILY FOR 7 DAYS   [DISCONTINUED] HYDROcodone-acetaminophen (NORCO/VICODIN) 5-325 MG tablet Take 1 tablet by mouth every 4 (four) hours as needed.   [DISCONTINUED] meloxicam (MOBIC) 7.5 MG tablet meloxicam 7.5 mg tablet   [DISCONTINUED] metFORMIN (GLUCOPHAGE-XR) 500 MG 24 hr tablet metformin ER 500 mg tablet,extended release 24 hr  TAKE 2 TABLETS BY MOUTH ONCE DAILY WITH EVENING MEAL   [DISCONTINUED] methocarbamol (ROBAXIN) 750 MG  tablet methocarbamol 750 mg tablet  TAKE 2 TABLETS BY MOUTH THREE TIMES DAILY FOR 3 DAYS   [DISCONTINUED] oxyCODONE-acetaminophen (PERCOCET) 5-325 MG tablet Take 1 tablet by mouth every 4 (four) hours as needed. (Patient not taking: No sig reported)   [DISCONTINUED] predniSONE (STERAPRED UNI-PAK 21 TAB) 10 MG (21) TBPK tablet Take 6 tabs for 2 days, then 5 for 2 days, then 4 for 2 days, then 3 for 2 days, 2 for 2 days, then 1 for 2 days   [DISCONTINUED] sulfamethoxazole-trimethoprim (BACTRIM DS) 800-160 MG tablet sulfamethoxazole 800 mg-trimethoprim 160 mg tablet  TAKE 1 TABLET BY MOUTH TWICE DAILY FOR 3 DAYS   No facility-administered encounter medications on file as of 02/10/2021.    REVIEW OF SYSTEMS:  Gen: + Night sweats. No fever or weight loss.  CV: Denies chest pain, palpitations or edema. Resp: Denies cough, shortness of breath of hemoptysis.  GI: See HPI.  GU : +  Urine leakage.  MS: + back pain and muscle pains.  Derm: Denies rash, itchiness, skin lesions or unhealing ulcers. Psych: + Anxiety and depression.  Heme: Denies bruising, bleeding. Neuro:  Denies headaches, dizziness or paresthesias. Endo:  Denies any problems with DM, thyroid or adrenal function.   PHYSICAL EXAM: BP 116/82 (BP Location: Left Arm, Patient Position: Sitting, Cuff Size: Normal)   Pulse 84   Ht 5' 4.75" (1.645 m) Comment: height measured without shoes  Wt 175 lb 8 oz  (79.6 kg)   BMI 29.43 kg/m   General: 55 year old female in no acute distress. Head: Normocephalic and atraumatic. Eyes:  Sclerae non-icteric, conjunctive pink. Ears: Normal auditory acuity. Mouth: Dentition intact. No ulcers or lesions. Tongue ring.  Neck: Supple, no lymphadenopathy or thyromegaly.  Lungs: Clear bilaterally to auscultation without wheezes, crackles or rhonchi. Heart: Regular rate and rhythm. No murmur, rub or gallop appreciated.  Abdomen: Soft, nontender, non distended. No masses. No hepatosplenomegaly. Normoactive bowel sounds x 4 quadrants. Umbilical ring. RLQ scar.  Rectal: Deferred.  Musculoskeletal: Symmetrical with no gross deformities. Skin: Warm and dry. No rash or lesions on visible extremities. Tattoo left lower abdomen.  Extremities: No edema. Neurological: Alert oriented x 4, no focal deficits.  Psychological:  Alert and cooperative. Normal mood and affect.  ASSESSMENT AND PLAN:  17. 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  -Colonoscopy to assess the rectum and J pouch, benefits and risks discussed including risk with sedation, risk of bleeding, perforation and infection  -Request past colon surgery records, past GI records from Dr. Roseanne Kaufman office  -Miralax PRN for constipation   2. GERD. Patient reports past history of EGD 15+ years ago by Dr. Gala Romney showed GERD and ulcers  -EGD benefits and risks discussed including risk with sedation, risk of bleeding, perforation and infection  -Omeprazole 20mg  one QD -GERD diet discussed   Further follow up to be determined after EGD and colonoscopy completed             CC:  Madison Hickman, FNP

## 2021-02-10 ENCOUNTER — Ambulatory Visit (INDEPENDENT_AMBULATORY_CARE_PROVIDER_SITE_OTHER): Payer: Medicare Other | Admitting: Nurse Practitioner

## 2021-02-10 ENCOUNTER — Encounter: Payer: Self-pay | Admitting: Nurse Practitioner

## 2021-02-10 VITALS — BP 116/82 | HR 84 | Ht 64.75 in | Wt 175.5 lb

## 2021-02-10 DIAGNOSIS — K219 Gastro-esophageal reflux disease without esophagitis: Secondary | ICD-10-CM | POA: Diagnosis not present

## 2021-02-10 DIAGNOSIS — Z1211 Encounter for screening for malignant neoplasm of colon: Secondary | ICD-10-CM

## 2021-02-10 NOTE — Patient Instructions (Addendum)
If you are age 55 or younger, your body mass index should be between 19-25. Your Body mass index is 29.43 kg/m. If this is out of the aformentioned range listed, please consider follow up with your Primary Care Provider.   PROCEDURES: You have been scheduled for an EGD and Colonoscopy. Please follow the written instructions given to you at your visit today. If you use inhalers (even only as needed), please bring them with you on the day of your procedure.   OVER THE COUNTER MEDICATION Please purchase the following medications over the counter and take as directed:  Miralax- Dissolve one capful in 8 ounces of water and drink before bed. Omeprazole 20 MG once a day.   It was great seeing you today! Thank you for entrusting me with your care and choosing [facility].  Noralyn Pick, CRNP  The [facility] providers would like to encourage you to use [facility] to communicate with providers for non-urgent requests or questions.  Due to long hold times on the telephone, sending your provider a message by [facility] may be faster and more efficient way to get a response. Please allow 48 business hours for a response.  Please remember that this is for non-urgent requests/questions.

## 2021-02-11 NOTE — Progress Notes (Signed)
Let me know if you receive any records.  I don't think any imaging is necessary at this point.  OK to proceed as planned.  thanks

## 2021-02-17 ENCOUNTER — Telehealth: Payer: Self-pay | Admitting: Nurse Practitioner

## 2021-02-17 NOTE — Telephone Encounter (Signed)
Inbound call from pt requesting a call back stating she has a questions regarding her procedure. Please advise. Thanks.

## 2021-02-17 NOTE — Telephone Encounter (Signed)
Spoke with the patient. Her procedure date was changed. She was confirming how to take her prep. Questions invited and answered.

## 2021-02-18 ENCOUNTER — Encounter: Payer: Medicare Other | Admitting: Gastroenterology

## 2021-02-24 ENCOUNTER — Other Ambulatory Visit: Payer: Self-pay

## 2021-02-24 ENCOUNTER — Encounter: Payer: Self-pay | Admitting: Gastroenterology

## 2021-02-24 ENCOUNTER — Ambulatory Visit (AMBULATORY_SURGERY_CENTER): Payer: Medicare Other | Admitting: Gastroenterology

## 2021-02-24 VITALS — BP 116/79 | HR 79 | Temp 97.7°F | Resp 14 | Ht 64.0 in | Wt 175.0 lb

## 2021-02-24 DIAGNOSIS — K2970 Gastritis, unspecified, without bleeding: Secondary | ICD-10-CM | POA: Diagnosis not present

## 2021-02-24 DIAGNOSIS — K2980 Duodenitis without bleeding: Secondary | ICD-10-CM

## 2021-02-24 DIAGNOSIS — Z1211 Encounter for screening for malignant neoplasm of colon: Secondary | ICD-10-CM

## 2021-02-24 DIAGNOSIS — K269 Duodenal ulcer, unspecified as acute or chronic, without hemorrhage or perforation: Secondary | ICD-10-CM

## 2021-02-24 DIAGNOSIS — K3189 Other diseases of stomach and duodenum: Secondary | ICD-10-CM

## 2021-02-24 DIAGNOSIS — R12 Heartburn: Secondary | ICD-10-CM | POA: Diagnosis not present

## 2021-02-24 DIAGNOSIS — K6289 Other specified diseases of anus and rectum: Secondary | ICD-10-CM

## 2021-02-24 DIAGNOSIS — K219 Gastro-esophageal reflux disease without esophagitis: Secondary | ICD-10-CM

## 2021-02-24 MED ORDER — SODIUM CHLORIDE 0.9 % IV SOLN: 500.0000 mL | Freq: Once | INTRAVENOUS | Status: DC

## 2021-02-24 MED ORDER — OMEPRAZOLE 40 MG PO CPDR: 40.0000 mg | DELAYED_RELEASE_CAPSULE | Freq: Every day | ORAL | 11 refills | Status: DC

## 2021-02-24 NOTE — Progress Notes (Signed)
Called to room to assist during endoscopic procedure.  Patient ID and intended procedure confirmed with present staff. Received instructions for my participation in the procedure from the performing physician.  

## 2021-02-24 NOTE — Op Note (Signed)
[name] Procedure Date: 02/24/2021 9:53 AM MRN: [id number] Endoscopist: Milus Banister , MD Age: 55 Referring MD:  Date of Birth: 27-Apr-1966 Gender: Female Account #: [id number] Procedure:                Colonoscopy Indications:              Screening for colorectal malignant neoplasm; very                            remote subtotal colectomy in [place] for "colon that                            stopped working", op report not available. Medicines:                Monitored Anesthesia Care Procedure:                Pre-Anesthesia Assessment:                           - Prior to the procedure, a History and Physical                            was performed, and patient medications and                            allergies were reviewed. The patient's tolerance of                            previous anesthesia was also reviewed. The risks                            and benefits of the procedure and the sedation                            options and risks were discussed with the patient.                            All questions were answered, and informed consent                            was obtained. Prior Anticoagulants: The patient has                            taken no previous anticoagulant or antiplatelet                            agents. ASA Grade Assessment: II - A patient with                            mild systemic disease. After reviewing the risks                            and benefits, the patient was deemed in  satisfactory condition to undergo the procedure.                           After obtaining informed consent, the colonoscope                            was passed under direct vision. Throughout the                            procedure, the patient's blood pressure, pulse, and                            oxygen saturations were monitored continuously. The                            CF [id number] #[id number]  was introduced through the anus                            and advanced to the the ileocolonic anastomosis.                            The colonoscopy was performed without difficulty.                            The patient tolerated the procedure well. The                            quality of the bowel preparation was good. The                            rectum was photographed. Scope In: 10:08:16 AM Scope Out: 10:13:51 AM Scope Withdrawal Time: 0 hours 3 minutes 31 seconds  Total Procedure Duration: 0 hours 5 minutes 35 seconds  Findings:                 Subtle end to end ileocolonic anastomosis located                            30cm from anus.                           The recto sigmoid colon mucosa appeared normal.                            Biopsies for histology were taken with a cold                            forceps from the rectosigmoid colon for evaluation                            of microscopic colitis.                           The exam was otherwise without abnormality on  direct and retroflexion views. Complications:            No immediate complications. Estimated blood loss:                            None. Estimated Blood Loss:     Estimated blood loss: none. Impression:               - Subtle end to end ileocolonic anastomosis located                            30cm from anus.                           - Normal rectosigmoid mucosa, biopsied to check for                            microscopic colitis.                           - The examination was otherwise normal on direct                            and retroflexion views. Recommendation:           - Await pathology results.                           - EGD now. Milus Banister, MD 02/24/2021 10:22:09 AM This report has been signed electronically.

## 2021-02-24 NOTE — Progress Notes (Signed)
PT taken to PACU. Monitors in place. VSS. Report given to RN. 

## 2021-02-24 NOTE — Progress Notes (Signed)
Pt's states no medical or surgical changes since previsit or office visit.  VS CW  

## 2021-02-24 NOTE — Patient Instructions (Addendum)
YOU HAD AN ENDOSCOPIC PROCEDURE TODAY AT [facility]:   Refer to the procedure report that was given to you for any specific questions about what was found during the examination.  If the procedure report does not answer your questions, please call your gastroenterologist to clarify.  If you requested that your care partner not be given the details of your procedure findings, then the procedure report has been included in a sealed envelope for you to review at your convenience later.  YOU SHOULD EXPECT: Some feelings of bloating in the abdomen. Passage of more gas than usual.  Walking can help get rid of the air that was put into your GI tract during the procedure and reduce the bloating. If you had a lower endoscopy (such as a colonoscopy or flexible sigmoidoscopy) you may notice spotting of blood in your stool or on the toilet paper. If you underwent a bowel prep for your procedure, you may not have a normal bowel movement for a few days.  Please Note:  You might notice some irritation and congestion in your nose or some drainage.  This is from the oxygen used during your procedure.  There is no need for concern and it should clear up in a day or so.  SYMPTOMS TO REPORT IMMEDIATELY:  Following lower endoscopy (colonoscopy or flexible sigmoidoscopy):  Excessive amounts of blood in the stool  Significant tenderness or worsening of abdominal pains  Swelling of the abdomen that is new, acute  Fever of 100F or higher  Following upper endoscopy (EGD)  Vomiting of blood or coffee ground material  New chest pain or pain under the shoulder blades  Painful or persistently difficult swallowing  New shortness of breath  Fever of 100F or higher  Black, tarry-looking stools  For urgent or emergent issues, a gastroenterologist can be reached at any hour by calling [phone]. Do not use MyChart messaging for urgent concerns.    DIET:  We do recommend a small meal at first, but  then you may proceed to your regular diet.  Drink plenty of fluids but you should avoid alcoholic beverages for 24 hours.  ACTIVITY:  You should plan to take it easy for the rest of today and you should NOT DRIVE or use heavy machinery until tomorrow (because of the sedation medicines used during the test).    FOLLOW UP: Our staff will call the number listed on your records 48-72 hours following your procedure to check on you and address any questions or concerns that you may have regarding the information given to you following your procedure. If we do not reach you, we will leave a message.  We will attempt to reach you two times.  During this call, we will ask if you have developed any symptoms of COVID 19. If you develop any symptoms (ie: fever, flu-like symptoms, shortness of breath, cough etc.) before then, please call [phone].  If you test positive for Covid 19 in the 2 weeks post procedure, please call and report this information to [place].    If any biopsies were taken you will be contacted by phone or by letter within the next 1-3 weeks.  Please call us at [phone] if you have not heard about the biopsies in 3 weeks.    SIGNATURES/CONFIDENTIALITY: You and/or your care partner have signed paperwork which will be entered into your electronic medical record.  These signatures attest to the fact that that the information above on your After  Visit Summary has been reviewed and is understood.  Full responsibility of the confidentiality of this discharge information lies with you and/or your care-partner.     Resume medications. Information given on gastritis. Pick up rx from [place].

## 2021-02-24 NOTE — Op Note (Signed)
[facility] Patient Name: Martha [name] Procedure Date: 02/24/2021 9:58 AM MRN: [id number] Endoscopist: Milus Banister , MD Age: 56 Referring MD:  Date of Birth: 1966-05-17 Gender: Female Account #: [id number] Procedure:                Upper GI endoscopy Indications:              Dyspepsia, Heartburn Medicines:                Monitored Anesthesia Care Procedure:                Pre-Anesthesia Assessment:                           - Prior to the procedure, a History and Physical                            was performed, and patient medications and                            allergies were reviewed. The patient's tolerance of                            previous anesthesia was also reviewed. The risks                            and benefits of the procedure and the sedation                            options and risks were discussed with the patient.                            All questions were answered, and informed consent                            was obtained. Prior Anticoagulants: The patient has                            taken no previous anticoagulant or antiplatelet                            agents. ASA Grade Assessment: II - A patient with                            mild systemic disease. After reviewing the risks                            and benefits, the patient was deemed in                            satisfactory condition to undergo the procedure.                           - Prior to the procedure, a History and Physical  was performed, and patient medications and                            allergies were reviewed. The patient's tolerance of                            previous anesthesia was also reviewed. The risks                            and benefits of the procedure and the sedation                            options and risks were discussed with the patient.                            All questions were answered, and informed  consent                            was obtained. Prior Anticoagulants: The patient has                            taken no previous anticoagulant or antiplatelet                            agents. ASA Grade Assessment: II - A patient with                            mild systemic disease. After reviewing the risks                            and benefits, the patient was deemed in                            satisfactory condition to undergo the procedure.                           After obtaining informed consent, the endoscope was                            passed under direct vision. Throughout the                            procedure, the patient's blood pressure, pulse, and                            oxygen saturations were monitored continuously. The                            GIF [id number] #[id number] was introduced through the                            mouth, and advanced to the second part of duodenum.  The upper GI endoscopy was accomplished without                            difficulty. The patient tolerated the procedure                            well. Scope In: Scope Out: Findings:                 Normal esophagus.                           Mild inflammation characterized by erythema,                            friability and granularity was found in the gastric                            antrum. Biopsies were taken with a cold forceps for                            histology.                           Several erosions in the duodenum (second segment)                           The exam was otherwise without abnormality. Complications:            No immediate complications. Estimated blood loss:                            None. Estimated Blood Loss:     Estimated blood loss: none. Impression:               - Gastritis and duodenitis. Biopsies taken from the                            stomach to check for H. pylori. Recommendation:           - Await  pathology results.                           - Patient has a contact number available for                            emergencies. The signs and symptoms of potential                            delayed complications were discussed with the                            patient. Return to normal activities tomorrow.                            Written discharge instructions were provided to the  patient.                           - Resume previous diet.                           - Continue present medications. New script written                            today for omeprazole 40mg  pills one pill shortly                            before breakfast every AM, disp 30 with 11 refills. Milus Banister, MD 02/24/2021 10:25:58 AM This report has been signed electronically.

## 2021-02-26 ENCOUNTER — Telehealth: Payer: Self-pay | Admitting: *Deleted

## 2021-02-26 DIAGNOSIS — M961 Postlaminectomy syndrome, not elsewhere classified: Secondary | ICD-10-CM | POA: Insufficient documentation

## 2021-02-26 NOTE — Telephone Encounter (Signed)
Have you developed a fever since your procedure? no  2.   Have you had an respiratory symptoms (SOB or cough) since your procedure? no  3.   Have you tested positive for COVID 19 since your procedure no  4.   Have you had any family members/close contacts diagnosed with the COVID 19 since your procedure?  no   If yes to any of these questions please route to Joylene John, RN and Joella Prince, RN  Follow up Call-  Call back number 02/24/2021  Post procedure Call Back phone  # [phone]  Permission to leave phone message Yes  Some recent data might be hidden     Patient questions:  Do you have a fever, pain , or abdominal swelling? No. Pain Score  0 *  Have you tolerated food without any problems? Yes.    Have you been able to return to your normal activities? Yes.    Do you have any questions about your discharge instructions: Diet   No. Medications  No. Follow up visit  No.  Do you have questions or concerns about your Care? No.  Actions: * If pain score is 4 or above: No action needed, pain <4.

## 2021-02-27 ENCOUNTER — Encounter: Payer: Self-pay | Admitting: Gastroenterology

## 2021-03-02 ENCOUNTER — Telehealth: Payer: Self-pay | Admitting: Gastroenterology

## 2021-03-02 NOTE — Telephone Encounter (Signed)
Inbound call from patient requesting a call back with results

## 2021-03-02 NOTE — Telephone Encounter (Signed)
I have provided the pt with the letter information.  She has been advised she will get a letter in 10 years. She should call if she has problems prior .   EDLA BURNOR [address] [address]     Dear Ms. Eulas Post,   The biopsies taken during your recent colonoscopy were normal as were the biopsies from your stomach. You should continue to follow the recommendations that we discussed at the time of your procedure.    You should continue to follow current colorectal cancer screening guidelines with a repeat colonoscopy in 10 years.     If you have any questions or concerns, please don't hesitate to call.   Sincerely,       Milus Banister, MD

## 2021-03-05 ENCOUNTER — Encounter: Payer: Self-pay | Admitting: Psychology

## 2021-04-14 ENCOUNTER — Encounter: Payer: Medicare Other | Attending: Psychology | Admitting: Psychology

## 2021-06-13 IMAGING — MG MM DIGITAL DIAGNOSTIC UNILAT*R* W/ TOMO W/ CAD
6 series · 6 of 18 positions shown · non-contrast
Comparison: Previous exam(s).

CLINICAL DATA: Possible mass in the posterior aspect of the
upper-outer right breast on a recent screening mammogram.

EXAM:
DIGITAL DIAGNOSTIC RIGHT MAMMOGRAM WITH TOMO
ULTRASOUND RIGHT BREAST

[R MLO synth-2D (1 of 2)]
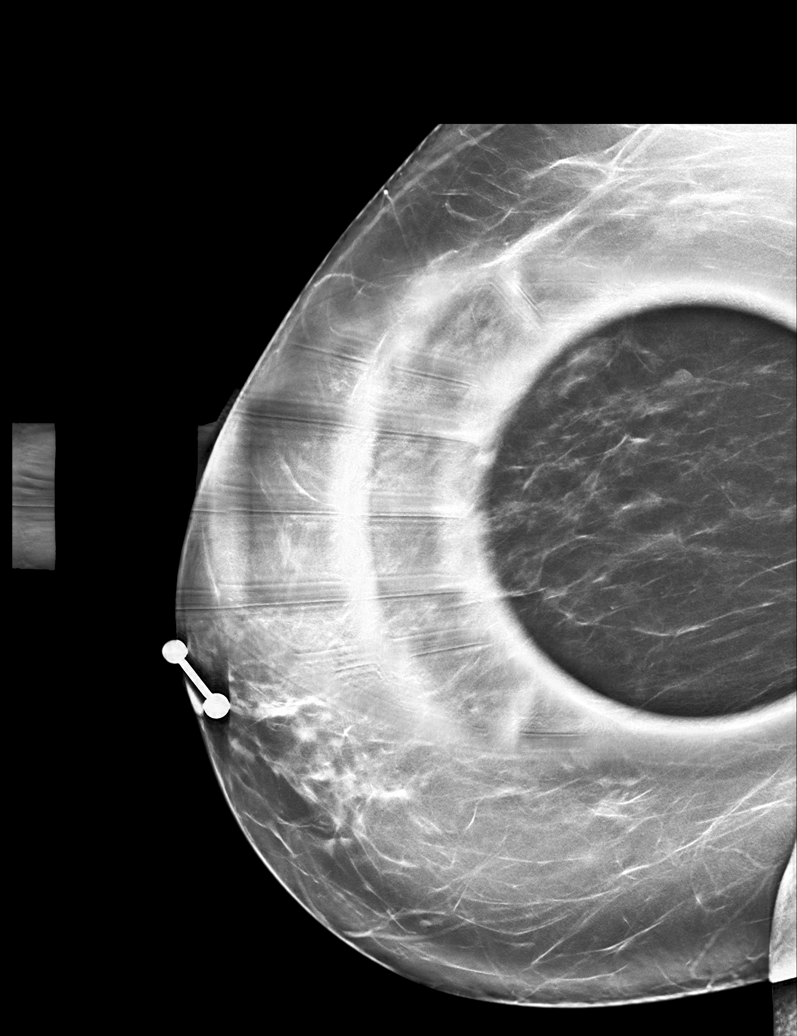

[R MLO synth-2D (2 of 2)]
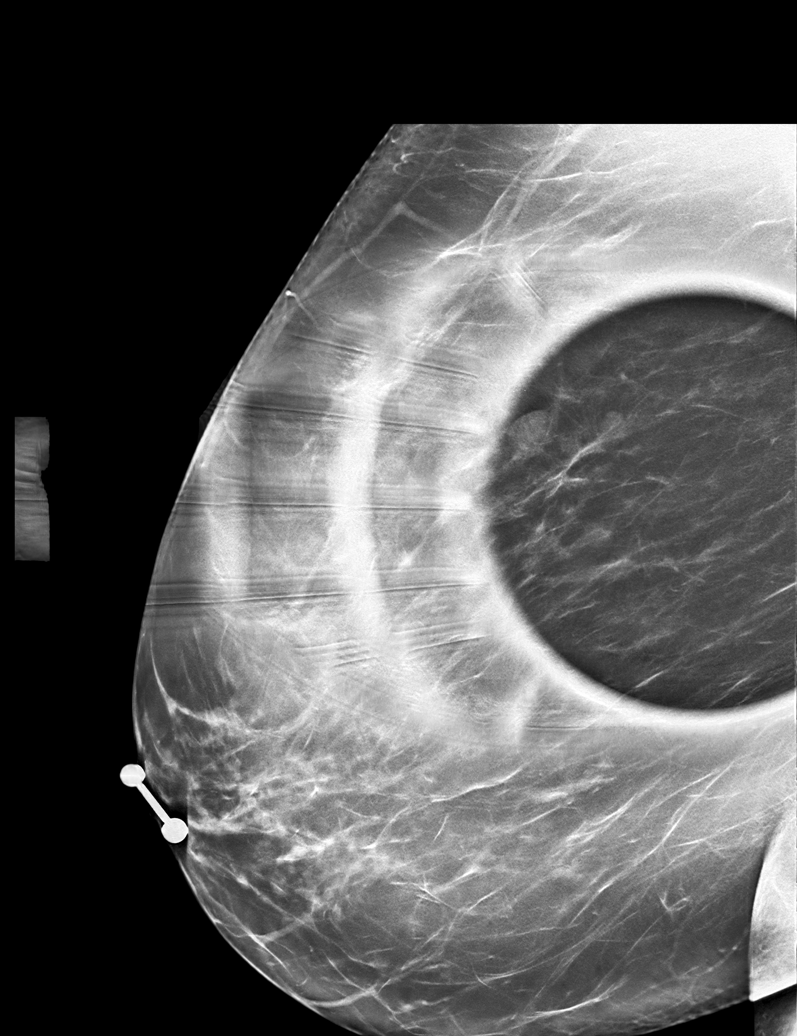

[R CC synth-2D]
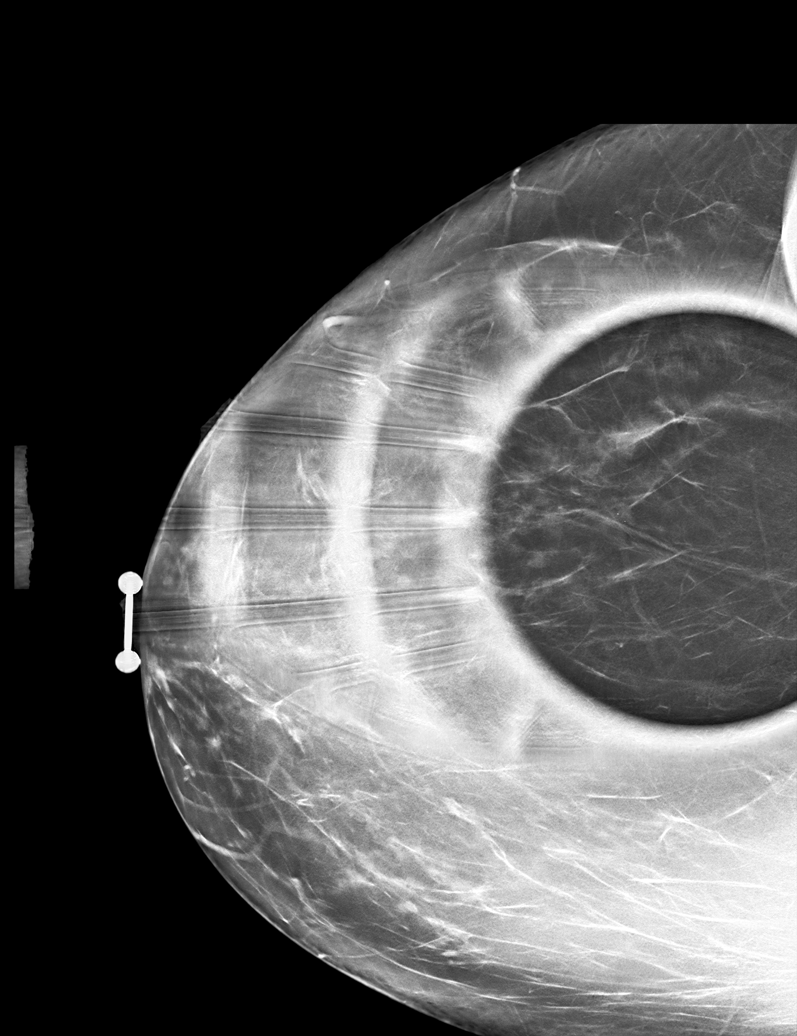

[R CC tomo · tomo slice 35/69.0]
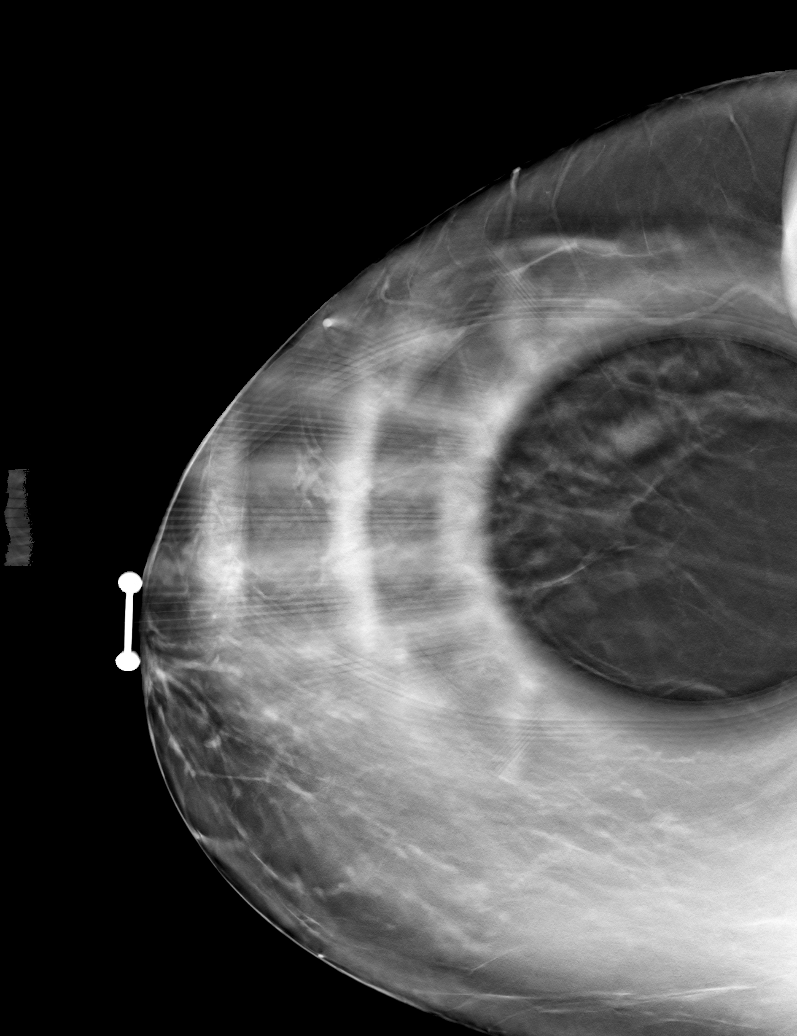

[R MLO tomo (1 of 2) · tomo slice 33/65.0]
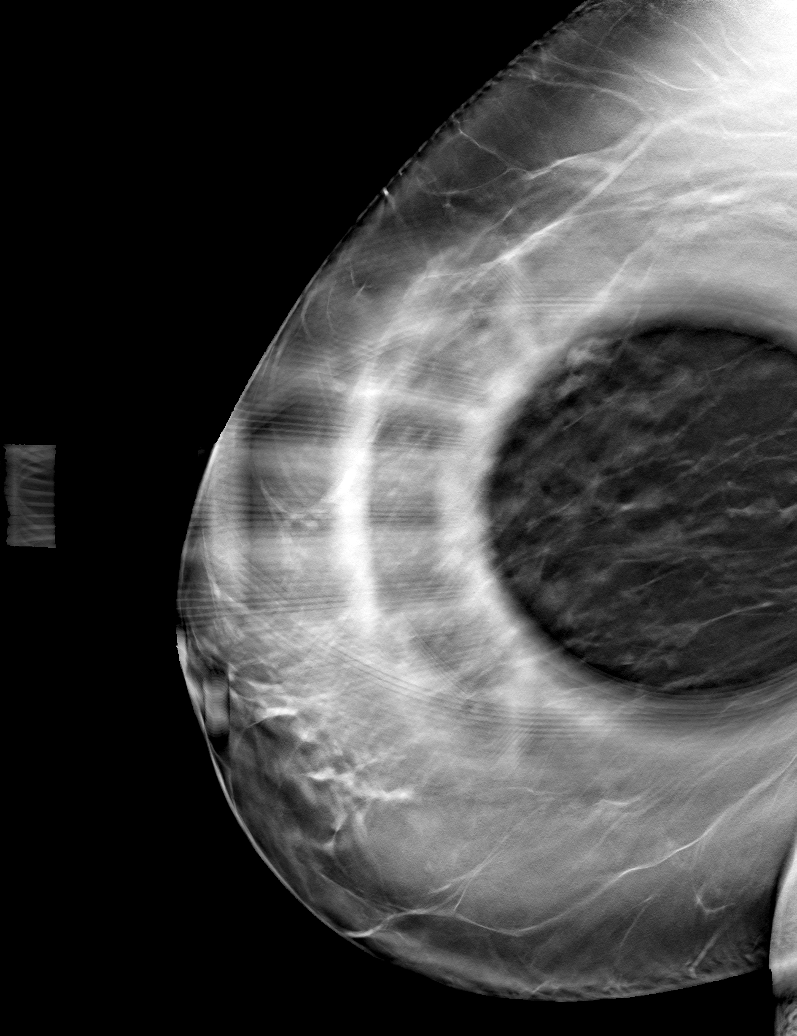

[R MLO tomo (2 of 2) · tomo slice 35/70.0]
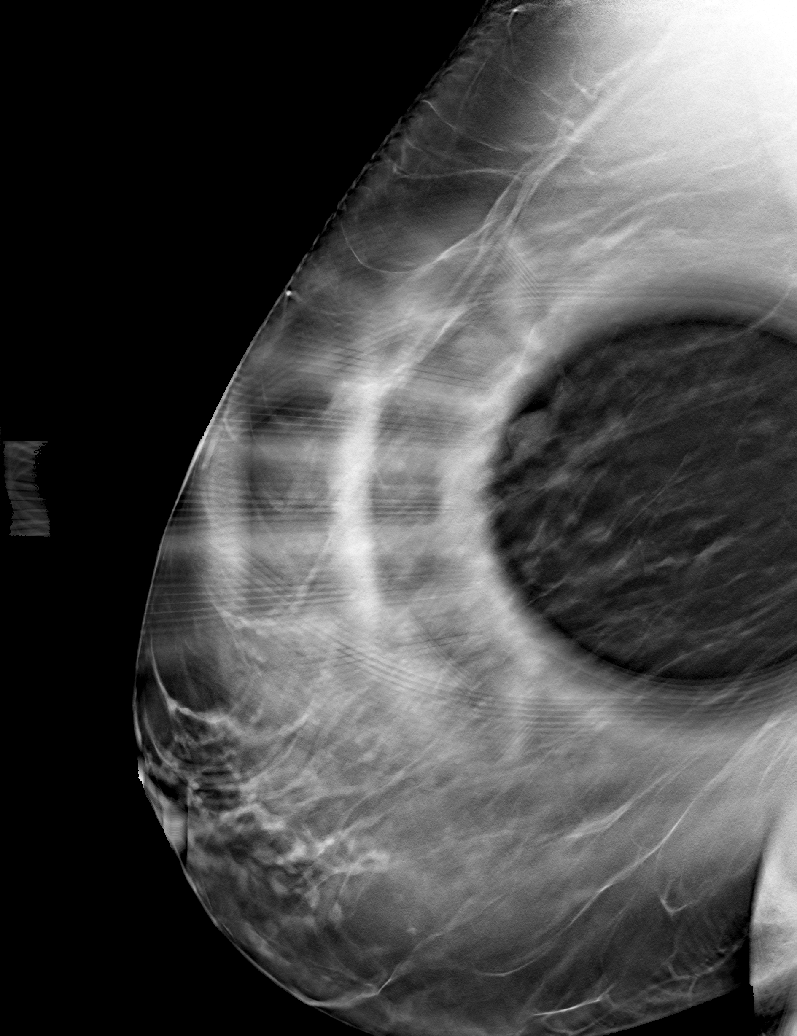

[6 of 18 positions shown; findings below may reference images not displayed]

ACR Breast Density Category b: There are scattered areas of
fibroglandular density.
FINDINGS: 3D tomographic and 2D generated spot compression images of the right
breast confirm an approximately 1 cm oval, partially circumscribed
and partially obscured mass in the posterior aspect of the breast in
approximately the 9 to 10 o'clock position of the breast.

Targeted ultrasound is performed, showing a 1.1 cm oval,
circumscribed, anechoic mass with increased through transmission of
sound in the 9 o'clock position of the right breast, 9 cm from the
nipple. This corresponds to the mammographic mass. No internal blood
flow was seen with power Doppler.
IMPRESSION: 1.1 cm simple right breast cyst.  No evidence of malignancy.

RECOMMENDATION:
Bilateral screening mammogram in 1 year.

I have discussed the findings and recommendations with the patient.
If applicable, a reminder letter will be sent to the patient
regarding the next appointment.

BI-RADS CATEGORY  2: Benign.

## 2021-07-09 ENCOUNTER — Ambulatory Visit: Payer: Medicare Other | Admitting: Psychology

## 2021-08-11 ENCOUNTER — Other Ambulatory Visit (HOSPITAL_COMMUNITY): Payer: Self-pay | Admitting: Family

## 2021-08-11 DIAGNOSIS — Z1231 Encounter for screening mammogram for malignant neoplasm of breast: Secondary | ICD-10-CM

## 2021-08-19 ENCOUNTER — Ambulatory Visit (HOSPITAL_COMMUNITY): Payer: Commercial Managed Care - HMO

## 2021-08-26 ENCOUNTER — Encounter (HOSPITAL_COMMUNITY): Payer: Self-pay

## 2021-08-26 ENCOUNTER — Ambulatory Visit (HOSPITAL_COMMUNITY): Payer: Commercial Managed Care - HMO

## 2021-09-30 ENCOUNTER — Encounter: Payer: Medicare Other | Admitting: Psychology

## 2021-11-04 ENCOUNTER — Encounter: Payer: Self-pay | Admitting: Psychology

## 2021-11-04 ENCOUNTER — Encounter: Payer: Medicare Other | Attending: Psychology | Admitting: Psychology

## 2021-11-04 DIAGNOSIS — Z981 Arthrodesis status: Secondary | ICD-10-CM | POA: Insufficient documentation

## 2021-11-04 DIAGNOSIS — G894 Chronic pain syndrome: Secondary | ICD-10-CM | POA: Diagnosis present

## 2021-11-04 DIAGNOSIS — F3341 Major depressive disorder, recurrent, in partial remission: Secondary | ICD-10-CM | POA: Diagnosis present

## 2021-11-04 NOTE — Progress Notes (Signed)
Psychological Evaluation ? ? ?Patient:   Martha [name]  ? ?DOB:   Mar 28, 1966 ? ?MR Number:  [id number] ? ?Location:  [facility] ?[place], [address] [place] ?[id number] ?[place] [address] ?Dept: [phone] ?          ?Date of Service:   11/04/2021 ? ?Start Time:   3 PM ?End Time:   4 PM ? ?Provider/Observer:  Ilean Skill, Psy.D.   ?    Clinical Neuropsychologist ?     ? ?Billing Code/Service: Psychological evaluation ? ?Reason for Service:  Martha [name] is a 56 year old female referred for a psychological evaluation as part of his standard work-up for consideration of spinal cord stimulator trial and possible implantation.  The patient has been followed by Dr. Rolena Infante.  The patient has had ongoing chronic pain symptoms with postlaminectomy syndrome with pain in both her back and neck.  She has had neck surgery and back surgery.  Her second back surgery occurred approximately 10 years ago after realizing the per surgery had not fully healed and she had to have corrective surgery.  The patient describes significant back pain, leg pain and hip pain.  Ongoing issues related to sleeping difficulties and difficulty standing, sitting and bending over.  The patient is also dealt with significant depression in the past and continues to take sertraline.  The patient reports that she hurts all of the time but does not want to take pain medications of opiate nature for concerns about what that would do over time.  The patient reports that she does have times where she just does not "want to get out of bed."  The patient reports that she has very poor sleep and will sleep approximately 3 to 4 hours of sleep at night on many days but has some days sometimes back to back where she does not sleep at all.  The patient reports that when she is in bed and goes to sleep she will wake up every 30 minutes to 1 hour and have  difficulty going back to sleep.  The patient reports that she is stressed about everything.  She reports that pain is the primary driver of the stress.  She has had some significant psychosocial stressors over the past 3 years with a divorce from her husband and difficulty during her marriage.  The patient reports that they are now divorced and have completed their separation and overall it is been good for her to divorce but it was a very difficult/hard time for her. ? ?The patient reports that she has an appropriate appetite but does have her significant sleep difficulties.  She reports of memory and attention have been impacted by all of this.  Past medical history includes neck, back, arm and hip pain as well as pain radiating around her buttocks into the front of her leg.  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  She has a history of lumbar fusion.  The patient also has a past history of diagnosis of ADHD and difficulties learning to read and spell effectively and potentially has some learning disabilities. ? ? ?Behavioral Observation: Martha [name]  presents as a 56 y.o.-year-old Right handed Caucasian Female who appeared her stated age. her dress was Appropriate and she was Well Groomed and her manners were Appropriate to the situation.  her participation was indicative of Appropriate  and Attentive behaviors.  There were physical disabilities noted.  she displayed an appropriate level of cooperation and motivation.   ? ? ?Interactions:    Active Appropriate ? ?Attention:   within normal limits and attention span and concentration were age appropriate ? ?Memory:   within normal limits; recent and remote memory intact ? ?Visuo-spatial:  not examined ? ?Speech (Volume):  normal ? ?Speech:   normal; normal ? ?Thought Process:  Coherent and Relevant ? ?Though Content:  WNL; not suicidal and not homicidal ? ?Orientation:   person, place,  time/date, and situation ? ?Judgment:   Good ? ?Planning:   Fair ? ?Affect:    Appropriate ? ?Mood:    Dysphoric ? ?Insight:   Good ? ?Intelligence:   normal ? ?Marital Status/Living: The patient was born and raised in [place] along with 7 siblings.  The patient is divorced and has 2 adult daughters age 66 and 72 in good health. ? ?Substance Use:  No concerns of substance abuse are reported. ? ?Education:   The patient completed the ninth grade at [employer] and had good grades in math with some difficulty in history. ? ?Medical History:   ?Past Medical History:  ?Diagnosis Date  ? Abdominal pain in female 11/27/2015  ? ADHD   ? Anxiety   ? Arthritis   ? Body aches 11/27/2015  ? Current use of estrogen therapy 11/27/2015  ? Depression   ? GERD (gastroesophageal reflux disease)   ? Heart murmur   ? Hematuria 11/27/2015  ? History of depression   ? History of kidney stones   ? "must still be there."  ? Hot flashes due to surgical menopause 11/27/2015  ? Hyperlipemia 11/27/2015  ? Hyperlipidemia   ? Mental disorder   ? history of depression  ? Moody 11/27/2015  ? Nausea 11/27/2015  ? Neck pain   ? PTSD (post-traumatic stress disorder)   ? PTSD (post-traumatic stress disorder)   ? Vaginal itching 12/11/2015  ? ? ?     ?Patient Active Problem List  ? Diagnosis Date Noted  ? Complication of surgical procedure 12/05/2020  ? History of lumbar fusion 12/05/2020  ? 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 Anxiety 08/29/2018  ? Vaginal itching 12/11/2015  ? Hyperlipemia 11/27/2015  ? Hot flashes due to surgical menopause 11/27/2015  ? Abdominal pain in female 11/27/2015  ? Nausea 11/27/2015  ? Pelvic pressure in female 11/27/2015  ? Hematuria 11/27/2015  ? Body aches 11/27/2015  ? Moody 11/27/2015  ? Current use of estrogen therapy 11/27/2015  ? Arm pain 11/11/2011  ? Other specified disorder of skin 11/11/2011   ? ?     ? ?     ?Abuse/Trauma History: While the patient denies a history of traumatic experiences her marriage was problematic and stressful but she is now divorced. ? ?Psychiatric History:  The patient has depressed psychiatric history including major depressive events, anxiety and being diagnosed with ADHD years ago. ? ?Family Med/Psych History:  ?Family History  ?Problem Relation Age of Onset  ? Bone cancer Mother   ? Emphysema Father   ? COPD Sister   ? Cancer Other   ? Colon cancer Neg Hx   ? Pancreatic cancer Neg Hx   ? Esophageal cancer Neg Hx   ? Colon polyps Neg Hx   ? ? ?Risk of Suicide/Violence: virtually non-existent patient denies any suicidal or homicidal ideation. ? ?Impression/DX:  Itzy Adler is a 56 year old female referred for a psychological evaluation as part of his standard work-up for consideration of spinal cord stimulator trial and possible implantation.  The patient has been followed by Dr. Rolena Infante.  The patient has had ongoing chronic pain symptoms with postlaminectomy syndrome with pain in both her back and neck.  She has had neck surgery and back surgery.  Her second back surgery occurred approximately 10 years ago after realizing the per surgery had not fully healed and she had to have corrective surgery.  The patient describes significant back pain, leg pain and hip pain.  Ongoing issues related to sleeping difficulties and difficulty standing, sitting and bending over.  The patient is also dealt with significant depression in the past and continues to take sertraline.  The patient reports that she hurts all of the time but does not want to take pain medications of opiate nature for concerns about what that would do over time.  The patient reports that she does have times where she just does not "want to get out of bed."  The patient reports that she has very poor sleep and will sleep approximately 3 to 4 hours of sleep at night on many days but has some days sometimes back to  back where she does not sleep at all.  The patient reports that when she is in bed and goes to sleep she will wake up every 30 minutes to 1 hour and have difficulty going back to sleep.  The patient reports that she is stressed abo

## 2021-12-23 ENCOUNTER — Encounter: Payer: Medicare Other | Attending: Psychology | Admitting: Psychology

## 2021-12-23 ENCOUNTER — Encounter: Payer: Self-pay | Admitting: Psychology

## 2021-12-23 DIAGNOSIS — F419 Anxiety disorder, unspecified: Secondary | ICD-10-CM | POA: Diagnosis present

## 2021-12-23 DIAGNOSIS — F3341 Major depressive disorder, recurrent, in partial remission: Secondary | ICD-10-CM | POA: Diagnosis present

## 2021-12-23 DIAGNOSIS — G894 Chronic pain syndrome: Secondary | ICD-10-CM | POA: Insufficient documentation

## 2021-12-23 NOTE — Progress Notes (Signed)
Psychological Evaluation ? ? ?Patient:  Martha [name]  ? ?DOB: 30-Sep-1965 ? ?MR Number: [id number] ? ?Location: [facility] ?[place], [address] [place] ?[id number] ?[place] [address] ?Dept: [phone] ? ?Start: 4 PM ?End: 5 PM ? ?Provider/Observer:     Edgardo Roys PsyD ? ?Chief Complaint:      ?Chief Complaint  ?Patient presents with  ? Pain  ? Sleeping Problem  ? ? ?Reason For Service:      Martha [name] is a 56 year old female referred for a psychological evaluation as part of his standard work-up for consideration of spinal cord stimulator trial and possible implantation.  The patient has been followed by Dr. Rolena Infante.  The patient has had ongoing chronic pain symptoms with postlaminectomy syndrome with pain in both her back and neck.  She has had neck surgery and back surgery.  Her second back surgery occurred approximately 10 years ago after realizing the previous surgery had not fully healed, she had to have corrective surgery.  The patient describes significant back pain, leg pain and hip pain.  Ongoing issues related to sleeping difficulties and difficulty standing, sitting and bending over.  The patient has also dealt with significant depression in the past and continues to take sertraline.  The patient reports that she hurts all of the time but does not want to take pain medications in the opiate class for concerns about what that would do over time.  The patient reports that she does have times where she just does not "want to get out of bed."  The patient reports that she has very poor sleep and will sleep approximately 3 to 4 hours at night on many days but has some days sometimes back to back days where she does not sleep at all.  The patient reports that when she is in bed and goes to sleep she will wake up every 30 minutes to 1 hour and have difficulty going back to sleep.  The patient  reports that she is stressed about everything.  She reports that pain is the primary driver of the stress.  She has had some significant psychosocial stressors over the past 3 years with a divorce from her husband and difficulty during her marriage.  The patient reports that they are now divorced and have completed their separation and overall it is been good for her to divorce but it was a very difficult/hard time for her. ? ?The patient reports that she has an appropriate appetite but does have her significant sleep difficulties.  She reports memory and attention have been impacted by all of this.  Past medical history includes neck, back, arm and hip pain as well as pain radiating around her buttocks into the front of her leg.  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  She has a history of lumbar fusion.  The patient also has a past history of diagnosis of ADHD and difficulties learning to read and spell effectively and potentially has some learning disabilities. ? ?Testing Administered:  The patient completed the [place] multiphasic personality inventory-II as well as the pain patient profile (P3) to provide an objective assessment of various psychological/psychiatric features that would be pertinent to consideration of spinal cord stimulator trialing and possible implantation. ? ?Participation Level:   Active ? ?Participation Quality:  Appropriate   ?   ?Behavioral Observation:  Well Groomed, Alert, and Appropriate.  ? ?Test Results:   Along with the medical records review and formal face-to-face clinical interview the patient also completed both the [place] multiphasic personality inventory as well as the pain patient profile to provide objective assessment of a number of psychological and psychiatric variables.  Initially, the patient completed the [place] multiphasic personality inventory-II.  The resulting validity scales do  suggest that the patient approached this measure in an honest and straightforward manner neither attempting to exaggerate or minimize current symptomatology.  This does appear to be a valid assessment. ? ?Resulting clinical scales acknowledge and confirm patient's clinical reports of significant history of depression and anxiety that have been going on for many years as well as both specific as well as more generalized medical and health concerns and significant pain.  The patient shows significant elevations on elements of depression, anxiety and physical difficulties as well as times of significant anxiety and difficulty controlling her thoughts and feelings.  Further analysis utilizing constant scales highlight the difficulties she is experiencing around her health status and stressors within the family and work setting or activities where she is trying to achieve goals.  Supplemental scales do not show a significant elevation on the McAndrews scale which has been shown to correlate with vulnerability to alcohol and substance abuse.  There is no significant elevation on the scale.  The patient does have significant elevations within the scales associated with significant chronic posttraumatic stress disorder as well as indications of feeling overwhelmed by various stressors.  Elements of depression that are highlighted included feelings of anhedonia sadness and malaise as well as physical malfunction and cognitive difficulties both attentional and memory in nature.  The patient tends to ruminate on challenges and difficulties as well.  The patient describes significant malaise both directly during the clinical interview as well as having significant elevation on her MMPI related to these complaints.  The patient feels as if she has little external support from others and likely has regular feelings of that others may even to the extent be actively attempting to manipulate and harm her.  There are times when she  feels like she is losing control over her thoughts and feelings and feels alienated from others and alienated from her on thoughts and feelings. ? ?As some of the variables of the MMPI can be elevated and misinterpreted in chronic pain patients the patient also completed the pain patient inventory in which elements such as depression anxiety and hyper physical focuses can be compared against chronic pain patients do not over interpret these variables from the MMPI.  The patient did endorse a number of significant items that are consistent with significant hyperfocus of her physical status as well as significant anxiety and depression both relative to a community-based normative sample as well as a chronic pain patient sample.  The patient's depression and anxiety go beyond those that typically are due solely to pain and physical discomfort.  Clearly, the patient has had times of significant stressors and traumatic experiences are likely a culprit that are continuing to play a role in her current functioning. ? ?Impression/Diagnosis:   The results of the current psychological evaluation do highlight some concerns that would potentially pose difficulties for the patient to accurately judge the effectiveness of the trialing phase for spinal cord stimulator.  While I do think in general she is a good candidate for these interventions from a psychological standpoint as her chronic pain and severe difficulties with sleep resulting  from pain are a major force in her depression and distress, her current level of depression, anxiety and residual effects of PTSD are quite high and continuing to be quite variable.  Patient is continuing to take sertraline, which can be quite helpful with the symptoms but even with these interventions there is still significant levels of emotional distress.  At this point, she does not appear to be a great candidate from a psychological perspective for spinal cord stimulator trialing but I do  think that many of these features could be much more effectively managed allowing her to go through this procedure in the not too distant future. ? ?My recommendations would be that the patient review her curre

## 2021-12-29 ENCOUNTER — Encounter: Payer: Medicare Other | Admitting: Psychology

## 2022-04-05 ENCOUNTER — Other Ambulatory Visit (HOSPITAL_COMMUNITY): Payer: Self-pay | Admitting: Family

## 2022-04-05 DIAGNOSIS — Z1231 Encounter for screening mammogram for malignant neoplasm of breast: Secondary | ICD-10-CM

## 2022-04-08 ENCOUNTER — Ambulatory Visit: Payer: Medicare Other | Admitting: Psychology

## 2022-05-06 ENCOUNTER — Ambulatory Visit (HOSPITAL_COMMUNITY)
Admission: RE | Admit: 2022-05-06 | Discharge: 2022-05-06 | Disposition: A | Discharge status: Home / Self Care | Payer: Medicare Other | Source: Ambulatory Visit | Attending: Family | Admitting: Family

## 2022-05-06 DIAGNOSIS — Z1231 Encounter for screening mammogram for malignant neoplasm of breast: Secondary | ICD-10-CM | POA: Insufficient documentation

## 2022-05-07 ENCOUNTER — Other Ambulatory Visit (HOSPITAL_COMMUNITY): Payer: Self-pay | Admitting: Family

## 2022-05-11 ENCOUNTER — Other Ambulatory Visit (HOSPITAL_COMMUNITY): Payer: Self-pay | Admitting: Family

## 2022-05-11 DIAGNOSIS — R928 Other abnormal and inconclusive findings on diagnostic imaging of breast: Secondary | ICD-10-CM

## 2022-05-13 ENCOUNTER — Ambulatory Visit (HOSPITAL_COMMUNITY)
Admission: RE | Admit: 2022-05-13 | Discharge: 2022-05-13 | Disposition: A | Discharge status: Home / Self Care | Payer: Medicare Other | Source: Ambulatory Visit | Attending: Family | Admitting: Family

## 2022-05-13 DIAGNOSIS — R928 Other abnormal and inconclusive findings on diagnostic imaging of breast: Secondary | ICD-10-CM | POA: Insufficient documentation

## 2022-06-01 ENCOUNTER — Ambulatory Visit (HOSPITAL_COMMUNITY): Payer: Medicare Other

## 2022-10-07 ENCOUNTER — Encounter: Payer: Self-pay | Admitting: Radiology

## 2022-10-16 ENCOUNTER — Other Ambulatory Visit: Payer: Self-pay

## 2022-10-16 ENCOUNTER — Emergency Department (HOSPITAL_COMMUNITY): Payer: 59

## 2022-10-16 ENCOUNTER — Emergency Department (HOSPITAL_COMMUNITY)
Admission: EM | Admit: 2022-10-16 | Discharge: 2022-10-16 | Disposition: A | Discharge status: Home / Self Care | Payer: 59 | Attending: Emergency Medicine | Admitting: Emergency Medicine

## 2022-10-16 DIAGNOSIS — R0789 Other chest pain: Secondary | ICD-10-CM | POA: Insufficient documentation

## 2022-10-16 DIAGNOSIS — R7309 Other abnormal glucose: Secondary | ICD-10-CM | POA: Diagnosis not present

## 2022-10-16 DIAGNOSIS — I517 Cardiomegaly: Secondary | ICD-10-CM | POA: Insufficient documentation

## 2022-10-16 DIAGNOSIS — R7989 Other specified abnormal findings of blood chemistry: Secondary | ICD-10-CM | POA: Insufficient documentation

## 2022-10-16 DIAGNOSIS — E785 Hyperlipidemia, unspecified: Secondary | ICD-10-CM | POA: Diagnosis not present

## 2022-10-16 DIAGNOSIS — Z91040 Latex allergy status: Secondary | ICD-10-CM | POA: Diagnosis not present

## 2022-10-16 DIAGNOSIS — R079 Chest pain, unspecified: Secondary | ICD-10-CM

## 2022-10-16 DIAGNOSIS — Z20822 Contact with and (suspected) exposure to covid-19: Secondary | ICD-10-CM | POA: Insufficient documentation

## 2022-10-16 DIAGNOSIS — R0602 Shortness of breath: Secondary | ICD-10-CM | POA: Insufficient documentation

## 2022-10-16 DIAGNOSIS — M549 Dorsalgia, unspecified: Secondary | ICD-10-CM | POA: Diagnosis not present

## 2022-10-16 LAB — RESP PANEL BY RT-PCR (RSV, FLU A&B, COVID)  RVPGX2
Influenza A by PCR: NEGATIVE
Influenza B by PCR: NEGATIVE
Resp Syncytial Virus by PCR: NEGATIVE
SARS Coronavirus 2 by RT PCR: NEGATIVE

## 2022-10-16 LAB — CBC
HCT: 38 % (ref 36.0–46.0)
Hemoglobin: 12.8 g/dL (ref 12.0–15.0)
MCH: 31.1 pg (ref 26.0–34.0)
MCHC: 33.7 g/dL (ref 30.0–36.0)
MCV: 92.2 fL (ref 80.0–100.0)
Platelets: 160 10*3/uL (ref 150–400)
RBC: 4.12 MIL/uL (ref 3.87–5.11)
RDW: 11.7 % (ref 11.5–15.5)
WBC: 7.2 10*3/uL (ref 4.0–10.5)
nRBC: 0 % (ref 0.0–0.2)

## 2022-10-16 LAB — COMPREHENSIVE METABOLIC PANEL
ALT: 16 U/L (ref 0–44)
AST: 18 U/L (ref 15–41)
Albumin: 3.8 g/dL (ref 3.5–5.0)
Alkaline Phosphatase: 45 U/L (ref 38–126)
Anion gap: 7 (ref 5–15)
BUN: 17 mg/dL (ref 6–20)
CO2: 26 mmol/L (ref 22–32)
Calcium: 8.4 mg/dL — ABNORMAL LOW (ref 8.9–10.3)
Chloride: 102 mmol/L (ref 98–111)
Creatinine, Ser: 0.56 mg/dL (ref 0.44–1.00)
GFR, Estimated: >60 mL/min (ref >60)
Glucose, Bld: 83 mg/dL (ref 70–99)
Potassium: 3 mmol/L — ABNORMAL LOW (ref 3.5–5.1)
Sodium: 135 mmol/L (ref 135–145)
Total Bilirubin: 0.4 mg/dL (ref 0.3–1.2)
Total Protein: 6.8 g/dL (ref 6.5–8.1)

## 2022-10-16 LAB — TROPONIN I (HIGH SENSITIVITY)
Troponin I (High Sensitivity): 3 ng/L (ref <18)
Troponin I (High Sensitivity): 3 ng/L (ref <18)

## 2022-10-16 LAB — TSH: TSH: 0.757 u[IU]/mL (ref 0.350–4.500)

## 2022-10-16 LAB — BRAIN NATRIURETIC PEPTIDE: B Natriuretic Peptide: 43 pg/mL (ref 0.0–100.0)

## 2022-10-16 LAB — LIPASE, BLOOD: Lipase: 42 U/L (ref 11–51)

## 2022-10-16 LAB — CBG MONITORING, ED: Glucose-Capillary: 81 mg/dL (ref 70–99)

## 2022-10-16 LAB — D-DIMER, QUANTITATIVE: D-Dimer, Quant: 0.66 ug/mL-FEU — ABNORMAL HIGH (ref 0.00–0.50)

## 2022-10-16 MED ORDER — ONDANSETRON HCL 4 MG/2ML IJ SOLN
4.0000 mg | Freq: Once | INTRAMUSCULAR | Status: AC
  Administered 2022-10-16: 4 mg via INTRAVENOUS
  Filled 2022-10-16: qty 2

## 2022-10-16 MED ORDER — FENTANYL CITRATE PF 50 MCG/ML IJ SOSY
50.0000 ug | PREFILLED_SYRINGE | Freq: Once | INTRAMUSCULAR | Status: AC
  Administered 2022-10-16: 50 ug via INTRAVENOUS
  Filled 2022-10-16: qty 1

## 2022-10-16 MED ORDER — IOHEXOL 350 MG/ML SOLN
75.0000 mL | Freq: Once | INTRAVENOUS | Status: AC | PRN
  Administered 2022-10-16: 75 mL via INTRAVENOUS

## 2022-10-16 MED ORDER — NAPROXEN 375 MG PO TABS: 375.0000 mg | ORAL_TABLET | Freq: Two times a day (BID) | ORAL | 0 refills | Status: DC

## 2022-10-16 NOTE — ED Provider Notes (Signed)
[facility] Provider Note   CSN: [id number] Arrival date & time: 10/16/22  1743     History  Chief Complaint  Patient presents with   Chest Pain    Martha [name] is a 57 y.o. female.   Chest Pain    Patient has a history of hyperlipidemia, depression, reflux PTSD and post cervical and lumbar laminectomy syndrome who presents to the ED with complaints of chest pain.  Patient states symptoms started a couple days ago.  It has been constant but worsening in severity.  Sometimes sharp sometimes chest pressure in her chest and it radiates to her back as well as her right arm.  Patient states she is feeling short of breath and nauseated with it.  She denies any abdominal pain.  She states she is also having pain in her back that goes up her head and down her legs.  Patient states at night she has trouble with discomfort in her feet and legs.  She states she has been told she has restless leg syndrome.  Patient states those symptoms have been going on for a longer period of time.  She also has been losing her hair.  She denies any history of PE.  No history of heart disease.  Home Medications Prior to Admission medications   Medication Sig Start Date End Date Taking? Authorizing Provider  amphetamine-dextroamphetamine (ADDERALL XR) 30 MG 24 hr capsule Take 30 mg by mouth every morning. 10/24/20  Yes [provider]  amphetamine-dextroamphetamine (ADDERALL) 10 MG tablet Take 10 mg by mouth daily. 10/24/20  Yes [provider]  diazepam (VALIUM) 2 MG tablet Take 2 mg by mouth 3 (three) times daily as needed for anxiety.   Yes [provider]  estradiol (ESTRACE) 1 MG tablet Take 1 tablet (1 mg total) by mouth daily. 06/03/16  Yes Estill Dooms, NP  Multiple Vitamins-Minerals (HAIR SKIN NAILS PO) Take by mouth.   Yes [provider]  Multiple Vitamins-Minerals (MULTIVITAMIN WITH MINERALS) tablet Take 1 tablet by  mouth daily.   Yes [provider]  naproxen (NAPROSYN) 375 MG tablet Take 1 tablet (375 mg total) by mouth 2 (two) times daily. 10/16/22  Yes Dorie Rank, MD  oxybutynin (DITROPAN-XL) 5 MG 24 hr tablet Take 5 mg by mouth daily. 09/23/20  Yes [provider]  OZEMPIC, 0.25 OR 0.5 MG/DOSE, 2 MG/3ML SOPN Inject 0.5 mg into the skin once a week. 10/04/22  Yes [provider]  rOPINIRole (REQUIP) 0.25 MG tablet Take 0.25-0.5 mg by mouth at bedtime as needed and may repeat dose one time if needed (restless leg syndrome). 09/23/20  Yes [provider]  rosuvastatin (CRESTOR) 20 MG tablet Take 20 mg by mouth daily.   Yes [provider]  sertraline (ZOLOFT) 50 MG tablet Take 1 tablet by mouth daily. 09/26/20  Yes [provider]      Allergies    Morphine and related, Codeine, and Latex    Review of Systems   Review of Systems  Cardiovascular:  Positive for chest pain.    Physical Exam Updated Vital Signs BP 115/71   Pulse (!) 47   Temp 98.1 F (36.7 C) (Oral)   Resp 13   Ht 1.626 m ('5\' 4"'$ )   Wt 59 kg   SpO2 96%   BMI 22.31 kg/m  Physical Exam Vitals and nursing note reviewed.  Constitutional:      Appearance: She is well-developed. She  is ill-appearing. She is not diaphoretic.  HENT:     Head: Normocephalic and atraumatic.     Right Ear: External ear normal.     Left Ear: External ear normal.  Eyes:     General: No scleral icterus.       Right eye: No discharge.        Left eye: No discharge.     Conjunctiva/sclera: Conjunctivae normal.  Neck:     Trachea: No tracheal deviation.  Cardiovascular:     Rate and Rhythm: Normal rate and regular rhythm.  Pulmonary:     Effort: Pulmonary effort is normal. No respiratory distress.     Breath sounds: Normal breath sounds. No stridor. No wheezing or rales.  Abdominal:     General: Bowel sounds are normal. There is no distension.     Palpations: Abdomen is soft.     Tenderness: There  is no abdominal tenderness. There is no guarding or rebound.  Musculoskeletal:        General: No tenderness or deformity.     Cervical back: Neck supple.     Right lower leg: No tenderness. No edema.     Left lower leg: No tenderness. No edema.     Comments: Normal pulses bilateral lower extremities  Skin:    General: Skin is warm and dry.     Findings: No rash.  Neurological:     General: No focal deficit present.     Mental Status: She is alert.     Cranial Nerves: No cranial nerve deficit, dysarthria or facial asymmetry.     Sensory: No sensory deficit.     Motor: No abnormal muscle tone or seizure activity.     Coordination: Coordination normal.  Psychiatric:        Mood and Affect: Mood normal.     ED Results / Procedures / Treatments   Labs (all labs ordered are listed, but only abnormal results are displayed) Labs Reviewed  COMPREHENSIVE METABOLIC PANEL - Abnormal; Notable for the following components:      Result Value   Potassium 3.0 (*)    Calcium 8.4 (*)    All other components within normal limits  D-DIMER, QUANTITATIVE - Abnormal; Notable for the following components:   D-Dimer, Quant 0.66 (*)    All other components within normal limits  RESP PANEL BY RT-PCR (RSV, FLU A&B, COVID)  RVPGX2  CBC  TSH  LIPASE, BLOOD  BRAIN NATRIURETIC PEPTIDE  T4, FREE  CBG MONITORING, ED  TROPONIN I (HIGH SENSITIVITY)  TROPONIN I (HIGH SENSITIVITY)    EKG EKG Interpretation  Date/Time:  Saturday October 16 2022 18:00:40 EST Ventricular Rate:  64 PR Interval:  153 QRS Duration: 100 QT Interval:  433 QTC Calculation: 447 R Axis:   75 Text Interpretation: Sinus rhythm Confirmed by Dorie Rank [phone]) on 10/16/2022 6:04:07 PM  Radiology CT Angio Chest PE W and/or Wo Contrast  Result Date: 10/16/2022 CLINICAL DATA:  Positive D-dimer with shortness of breath. EXAM: CT ANGIOGRAPHY CHEST WITH CONTRAST TECHNIQUE: Multidetector CT imaging of the chest was performed using the  standard protocol during bolus administration of intravenous contrast. Multiplanar CT image reconstructions and MIPs were obtained to evaluate the vascular anatomy. RADIATION DOSE REDUCTION: This exam was performed according to the departmental dose-optimization program which includes automated exposure control, adjustment of the mA and/or kV according to patient size and/or use of iterative reconstruction technique. CONTRAST:  72m OMNIPAQUE IOHEXOL 350 MG/ML SOLN COMPARISON:  CT angiogram chest  12/02/2020 FINDINGS: Cardiovascular: Satisfactory opacification of the pulmonary arteries to the segmental level. No evidence of pulmonary embolism. Normal heart size. No pericardial effusion. There are atherosclerotic calcifications of the coronary arteries. Mediastinum/Nodes: No enlarged mediastinal, hilar, or axillary lymph nodes. Thyroid gland, trachea, and esophagus demonstrate no significant findings. Lungs/Pleura: There is minimal air trapping in the right lower lobe. No focal lung infiltrate, pleural effusion or pneumothorax. Upper Abdomen: No acute abnormality. Musculoskeletal: Cervical spinal fusion plate is present. No acute osseous abnormality. Review of the MIP images confirms the above findings. IMPRESSION: 1. No evidence for pulmonary embolism. 2. Minimal air trapping in the right lower lobe which can be seen with small airways disease. Electronically Signed   By: Ronney Asters M.D.   On: 10/16/2022 19:33   DG Chest Portable 1 View  Result Date: 10/16/2022 CLINICAL DATA:  Chest pain and shortness of breath EXAM: PORTABLE CHEST 1 VIEW COMPARISON:  Chest radiograph 12/02/2020 FINDINGS: Monitoring leads overlie the patient. Cardiomegaly. Basilar opacities favored to represent atelectasis. No large area pulmonary consolidation. No pleural effusion or pneumothorax. IMPRESSION: Cardiomegaly. Basilar atelectasis. Electronically Signed   By: Lovey Newcomer M.D.   On: 10/16/2022 18:24    Procedures Procedures     Medications Ordered in ED Medications  fentaNYL (SUBLIMAZE) injection 50 mcg (50 mcg Intravenous Given 10/16/22 1824)  ondansetron (ZOFRAN) injection 4 mg (4 mg Intravenous Given 10/16/22 1823)  iohexol (OMNIPAQUE) 350 MG/ML injection 75 mL (75 mLs Intravenous Contrast Given 10/16/22 1915)    ED Course/ Medical Decision Making/ A&P Clinical Course as of 10/16/22 2133  Sat Oct 16, 2022  1907 D-dimer, quantitative(!) D-dimer evaded at 0.66.  CBC normal.  Metabolic panel shows potassium of 3.0. [JK]  1908 Chest x-ray without acute findings [JK]  1943 CT scan without PE.   [JK]    Clinical Course User Index [JK] Dorie Rank, MD                             Medical Decision Making Differential diagnosis includes but not limited to myocardial infarction, acute coronary syndrome, pulmonary embolism, aortic dissection, pleurisy, myalgia  Problems Addressed: Chest pain, unspecified type: acute illness or injury that poses a threat to life or bodily functions  Amount and/or Complexity of Data Reviewed Labs: ordered. Decision-making details documented in ED Course. Radiology: ordered and independent interpretation performed.  Risk Prescription drug management.   ED workup reassuring.  Serial troponins normal.  No signs to suggest acute coronary syndrome or myocardial infarction.  With the patient's complaint of hair loss TSH was ordered and there is no findings to suggest hypo or hyperthyroidism.  Patient's covid flu influenza are negative.  She did have an elevated D-dimer so a CT angio was performed and it does not show any acute abnormality.  They did note minimal air trapping trapping in the right lower lobe but patient has not had any cough or congestion CBC is normal.  Doubt pneumonia.  Possible symptoms could be related to pleurisy.  Patient also has been having leg cramping is possible she might have some type of fibromyalgia type condition.  Evaluation and diagnostic testing in the  emergency department does not suggest an emergent condition requiring admission or immediate intervention beyond what has been performed at this time.  The patient is safe for discharge and has been instructed to return immediately for worsening symptoms, change in symptoms or any other concerns.  Final Clinical Impression(s) / ED Diagnoses Final diagnoses:  Chest pain, unspecified type    Rx / DC Orders ED Discharge Orders          Ordered    Ambulatory referral to Cardiology       Comments: If you have not heard from the Cardiology office within the next 72 hours please call [phone].   10/16/22 2133    naproxen (NAPROSYN) 375 MG tablet  2 times daily        10/16/22 2133              Dorie Rank, MD 10/16/22 2135

## 2022-10-16 NOTE — Discharge Instructions (Signed)
Test in the ED were reassuring.  Take the medications as prescribed to help with the pain.  Follow-up with a cardiologist and your primary care doctor for further evaluation.

## 2022-10-16 NOTE — ED Triage Notes (Signed)
Patient reports constant chest pain for several days with SOB and nausea. Patient denies injury or trauma. Patient states she took 325 mg ASA prior to EMS arrival. EMS states they gave patient 0.4 mg NTG without improvement of symptoms.

## 2022-10-16 NOTE — ED Notes (Signed)
Called phlebotomist with lab - informed of need for 2nd trop - report on way to draw blood

## 2022-10-17 LAB — T4, FREE: Free T4: 1.02 ng/dL (ref 0.61–1.12)

## 2022-11-02 ENCOUNTER — Ambulatory Visit: Payer: 59 | Admitting: Internal Medicine

## 2022-11-04 ENCOUNTER — Ambulatory Visit: Payer: 59 | Attending: Internal Medicine | Admitting: Cardiology

## 2022-11-04 ENCOUNTER — Encounter: Payer: Self-pay | Admitting: Cardiology

## 2022-11-04 VITALS — BP 124/82 | HR 60 | Ht 65.0 in | Wt 130.0 lb

## 2022-11-04 DIAGNOSIS — Z01812 Encounter for preprocedural laboratory examination: Secondary | ICD-10-CM

## 2022-11-04 DIAGNOSIS — R0609 Other forms of dyspnea: Secondary | ICD-10-CM | POA: Diagnosis not present

## 2022-11-04 DIAGNOSIS — F4321 Adjustment disorder with depressed mood: Secondary | ICD-10-CM

## 2022-11-04 DIAGNOSIS — R072 Precordial pain: Secondary | ICD-10-CM

## 2022-11-04 DIAGNOSIS — Z634 Disappearance and death of family member: Secondary | ICD-10-CM

## 2022-11-04 DIAGNOSIS — E782 Mixed hyperlipidemia: Secondary | ICD-10-CM | POA: Diagnosis not present

## 2022-11-04 MED ORDER — NITROGLYCERIN 0.4 MG SL SUBL: 0.4000 mg | SUBLINGUAL_TABLET | SUBLINGUAL | 2 refills | Status: DC | PRN

## 2022-11-04 NOTE — Patient Instructions (Addendum)
Medication Instructions:   Nitroglycerin 0.4 mg place 1 tablet under your tongue every 5 minutes (up to three times) as needed for chest pain.   *If you need a refill on your cardiac medications before your next appointment, please call your pharmacy*   Lab Work: Your physician recommends that you have lab work in today:  BMET Mg2+  If you have labs (blood work) drawn today and your tests are completely normal, you will receive your results only by: MyChart Message (if you have MyChart) OR A paper copy in the mail If you have any lab test that is abnormal or we need to change your treatment, we will call you to review the results.   Testing/Procedures: Your physician has requested that you have an echocardiogram. Echocardiography is a painless test that uses sound waves to create images of your heart. It provides your doctor with information about the size and shape of your heart and how well your heart's chambers and valves are working. This procedure takes approximately one hour. There are no restrictions for this procedure. Please do NOT wear cologne, perfume, aftershave, or lotions (deodorant is allowed). Please arrive 15 minutes prior to your appointment time.    Your physician has requested that you have cardiac CT. Cardiac CT Angiography (CTA), is a special type of CT scan that uses a computer to produce multi-dimensional views of major blood vessels throughout the body. In CT angiography, a contrast material is injected through an IV to help visualize the blood vessels. A cardiac CT angiogram is a procedure to look at the heart and the area around the heart. It may be done to help find the cause of chest pains or other symptoms of heart disease. During this procedure, a substance called contrast dye is injected into a vein in the arm. The contrast highlights the blood vessels in the area to be checked. A large X-ray machine (CT scanner), then takes detailed pictures of the heart and  the surrounding area. The procedure is also sometimes called a coronary CT angiogram, coronary artery scanning, or CTA.    Follow-Up: At [facility], you and your health needs are our priority.  As part of our continuing mission to provide you with exceptional heart care, we have created designated Provider Care Teams.  These Care Teams include your primary Cardiologist (physician) and Advanced Practice Providers (APPs -  Physician Assistants and Nurse Practitioners) who all work together to provide you with the care you need, when you need it.  We recommend signing up for the patient portal called "MyChart".  Sign up information is provided on this After Visit Summary.  MyChart is used to connect with patients for Virtual Visits (Telemedicine).  Patients are able to view lab/test results, encounter notes, upcoming appointments, etc.  Non-urgent messages can be sent to your provider as well.   To learn more about what you can do with MyChart, go to [email or web address].    Your next appointment:   4 month(s)  Provider:   Berniece Salines, DO    Other Instructions   Your cardiac CT will be scheduled at one of the below locations:   [facility] [address] [place], [place] [address] [phone]  [facility] [address] [place], [place] [address] [phone]  [facility] [place], [place] [address] [phone]  If scheduled at [facility], please arrive at the  Women's and Children's Entrance (Entrance C2) of [facility] 30 minutes prior to test start time. You can use the FREE valet parking offered at entrance C (encouraged to control the heart rate for the test)  Proceed to the [facility] Radiology Department (first floor) to check-in and test prep.  All radiology patients and guests should use entrance C2 at [facility], accessed  from [facility], even though the hospital's physical address listed is [address].    If scheduled at [facility] or [facility], please arrive 15 mins early for check-in and test prep.   Please follow these instructions carefully (unless otherwise directed):  Hold all erectile dysfunction medications at least 3 days (72 hrs) prior to test. (Ie viagra, cialis, sildenafil, tadalafil, etc) We will administer nitroglycerin during this exam.   On the Night Before the Test: Be sure to Drink plenty of water. Do not consume any caffeinated/decaffeinated beverages or chocolate 12 hours prior to your test. Do not take any antihistamines 12 hours prior to your test.  On the Day of the Test: Drink plenty of water until 1 hour prior to the test. Do not eat any food 1 hour prior to test. You may take your regular medications prior to the test.  Take metoprolol (Lopressor) two hours prior to test. If you take Furosemide/Hydrochlorothiazide/Spironolactone, please HOLD on the morning of the test. FEMALES- please wear underwire-free bra if available, avoid dresses & tight clothing        After the Test: Drink plenty of water. After receiving IV contrast, you may experience a mild flushed feeling. This is normal. On occasion, you may experience a mild rash up to 24 hours after the test. This is not dangerous. If this occurs, you can take Benadryl 25 mg and increase your fluid intake. If you experience trouble breathing, this can be serious. If it is severe call 911 IMMEDIATELY. If it is mild, please call our office. If you take any of these medications: Glipizide/Metformin, Avandament, Glucavance, please do not take 48 hours after completing test unless otherwise instructed.  We will call to schedule your test 2-4 weeks out understanding that some insurance companies will need an authorization prior to the service being performed.    For non-scheduling related questions, please contact the cardiac imaging nurse navigator should you have any questions/concerns: Marchia Bond, Cardiac Imaging Nurse Navigator Gordy Clement, Cardiac Imaging Nurse Navigator [facility] Direct Office Dial: [phone]   For scheduling needs, including cancellations and rescheduling, please call [place], [phone].

## 2022-11-04 NOTE — Progress Notes (Signed)
Cardiology Office Note:    Date:  11/07/2022   ID:  Martha [name], DOB 10-01-1965, MRN [id number]  PCP:  Madison Hickman, FNP  Cardiologist:  Berniece Salines, DO  Electrophysiologist:  None   Referring MD: Dorie Rank, MD   " I am experiencing chest pain"  History of Present Illness:    Martha [name] is a 57 y.o. female with a hx of depression, GERD, hyperlipidemia, prediabetes hemoglobin A1c in May 2022 6.2 here today to be evaluated for chest discomfort.  She tells me she has been experiencing intermittent chest discomfort along with significant shortness of breath.  She notes that is getting worse.  She is concerned because she has never had this issue before.  She described it as a midsternal chest tightness which happens and then she gets significant shortness of breath and last visit she feels that she has no energy.  Is been going on for several weeks now.  She was seen at the emergency room at any pain on March night and was referred to cardiology.  Of note she is mourning the loss of her daughter which happened last July 2022 including neurocomplaints at this time.  Past Medical History:  Diagnosis Date   Abdominal pain in female 11/27/2015   ADHD    Anxiety    Arthritis    Body aches 11/27/2015   Current use of estrogen therapy 11/27/2015   Depression    GERD (gastroesophageal reflux disease)    Heart murmur    Hematuria 11/27/2015   History of depression    History of kidney stones    "must still be there."   Hot flashes due to surgical menopause 11/27/2015   Hyperlipemia 11/27/2015   Hyperlipidemia    Mental disorder    history of depression   Moody 11/27/2015   Nausea 11/27/2015   Neck pain    PTSD (post-traumatic stress disorder)    PTSD (post-traumatic stress disorder)    Vaginal itching 12/11/2015    Past Surgical History:  Procedure Laterality Date   ABDOMINAL HYSTERECTOMY     APPENDECTOMY     BACK SURGERY     CARPAL TUNNEL RELEASE Right    COLON  SURGERY     COLONOSCOPY     LUMBAR FUSION     L5-S1   NECK SURGERY     x 2   TOOTH EXTRACTION N/A 11/14/2020   Procedure: DENTAL RESTORATION/EXTRACTIONS;  Surgeon: Diona Browner, DMD;  Location: [facility];  Service: Oral Surgery;  Laterality: N/A;   UPPER GASTROINTESTINAL ENDOSCOPY     WRIST SURGERY Left    cyst removal    Current Medications: Current Meds  Medication Sig   amphetamine-dextroamphetamine (ADDERALL XR) 30 MG 24 hr capsule Take 30 mg by mouth every morning.   amphetamine-dextroamphetamine (ADDERALL) 10 MG tablet Take 10 mg by mouth daily.   diazepam (VALIUM) 2 MG tablet Take 2 mg by mouth 3 (three) times daily as needed for anxiety.   estradiol (ESTRACE) 1 MG tablet Take 1 tablet (1 mg total) by mouth daily.   Multiple Vitamins-Minerals (HAIR SKIN NAILS PO) Take by mouth.   Multiple Vitamins-Minerals (MULTIVITAMIN WITH MINERALS) tablet Take 1 tablet by mouth daily.   nitroGLYCERIN (NITROSTAT) 0.4 MG SL tablet Place 1 tablet (0.4 mg total) under the tongue every 5 (five) minutes as needed for chest pain.   oxybutynin (DITROPAN-XL) 5 MG 24 hr tablet Take 5 mg by mouth daily.   OZEMPIC, 0.25 OR 0.5 MG/DOSE, 2  MG/3ML SOPN Inject 0.5 mg into the skin once a week.   rOPINIRole (REQUIP) 0.25 MG tablet Take 0.25-0.5 mg by mouth at bedtime as needed and may repeat dose one time if needed (restless leg syndrome).   rosuvastatin (CRESTOR) 20 MG tablet Take 20 mg by mouth daily.   sertraline (ZOLOFT) 50 MG tablet Take 1 tablet by mouth daily.   Current Facility-Administered Medications for the 11/04/22 encounter (Office Visit) with Berniece Salines, DO  Medication   0.9 %  sodium chloride infusion     Allergies:   Morphine and related, Codeine, and Latex   Social History   Socioeconomic History   Marital status: Legally Separated    Spouse name: Not on file   Number of children: 2   Years of education: Not on file   Highest education level: Not on file  Occupational History    Occupation: disabled  Tobacco Use   Smoking status: Former    Packs/day: 1.00    Years: 25.00    Additional pack years: 0.00    Total pack years: 25.00    Types: Cigarettes    Quit date: 03/11/2013    Years since quitting: 9.6   Smokeless tobacco: Never   Tobacco comments:    vapes  Vaping Use   Vaping Use: Former   Quit date: 11/26/2020  Substance and Sexual Activity   Alcohol use: No   Drug use: No   Sexual activity: Not Currently    Birth control/protection: Surgical    Comment: hyst  Other Topics Concern   Not on file  Social History Narrative   Not on file   Social Determinants of Health   Financial Resource Strain: Not on file  Food Insecurity: Not on file  Transportation Needs: Not on file  Physical Activity: Not on file  Stress: Not on file  Social Connections: Not on file     Family History: The patient's family history includes Bone cancer in her mother; COPD in her sister; Cancer in an other family member; Emphysema in her father. There is no history of Colon cancer, Pancreatic cancer, Esophageal cancer, or Colon polyps.  ROS:   Review of Systems  Constitution: Negative for decreased appetite, fever and weight gain.  HENT: Negative for congestion, ear discharge, hoarse voice and sore throat.   Eyes: Negative for discharge, redness, vision loss in right eye and visual halos.  Cardiovascular: Reports chest pain, dyspnea on exertion and fatigue. Negative for  leg swelling, orthopnea and palpitations.  Respiratory: Negative for cough, hemoptysis, shortness of breath and snoring.   Endocrine: Negative for heat intolerance and polyphagia.  Hematologic/Lymphatic: Negative for bleeding problem. Does not bruise/bleed easily.  Skin: Negative for flushing, nail changes, rash and suspicious lesions.  Musculoskeletal: Negative for arthritis, joint pain, muscle cramps, myalgias, neck pain and stiffness.  Gastrointestinal: Negative for abdominal pain, bowel incontinence,  diarrhea and excessive appetite.  Genitourinary: Negative for decreased libido, genital sores and incomplete emptying.  Neurological: Negative for brief paralysis, focal weakness, headaches and loss of balance.  Psychiatric/Behavioral: Negative for altered mental status, depression and suicidal ideas.  Allergic/Immunologic: Negative for HIV exposure and persistent infections.    EKGs/Labs/Other Studies Reviewed:    The following studies were reviewed today:   EKG:  None today   Recent Labs: 10/16/2022: ALT 16; B Natriuretic Peptide 43.0; Hemoglobin 12.8; Platelets 160; TSH 0.757 11/04/2022: BUN 14; Creatinine, Ser 0.70; Magnesium 1.6; Potassium 3.8; Sodium 141  Recent Lipid Panel    Component Value  Date/Time   CHOL 196 12/11/2020 0000   CHOL 250 (H) 11/27/2015 1132   TRIG 179 (A) 12/11/2020 0000   HDL 79 (A) 12/11/2020 0000   HDL 78 11/27/2015 1132   CHOLHDL 3.2 11/27/2015 1132   CHOLHDL 4.9 07/07/2010 0609   VLDL 30 07/07/2010 0609   LDLCALC 87 12/11/2020 0000   LDLCALC 156 (H) 11/27/2015 1132    Physical Exam:    VS:  BP 124/82 (BP Location: Left Arm, Patient Position: Sitting, Cuff Size: Normal)   Pulse 60   Ht 5\' 5"  (1.651 m)   Wt 130 lb (59 kg)   SpO2 98%   BMI 21.63 kg/m     Wt Readings from Last 3 Encounters:  11/04/22 130 lb (59 kg)  10/16/22 130 lb (59 kg)  02/24/21 175 lb (79.4 kg)     GEN: Well nourished, well developed in no acute distress HEENT: Normal NECK: No JVD; No carotid bruits LYMPHATICS: No lymphadenopathy CARDIAC: S1S2 noted,RRR, no murmurs, rubs, gallops RESPIRATORY:  Clear to auscultation without rales, wheezing or rhonchi  ABDOMEN: Soft, non-tender, non-distended, +bowel sounds, no guarding. EXTREMITIES: No edema, No cyanosis, no clubbing MUSCULOSKELETAL:  No deformity  SKIN: Warm and dry NEUROLOGIC:  Alert and oriented x 3, non-focal PSYCHIATRIC:  Normal affect, good insight  ASSESSMENT:    1. Precordial pain   2. DOE (dyspnea on  exertion)   3. Pre-procedure lab exam   4. Mixed hyperlipidemia   5. Grief at loss of child    PLAN:    The symptoms chest pain is concerning, this patient does have intermediate risk for coronary artery disease and at this time I would like to pursue an ischemic evaluation in this patient.  Shared decision a coronary CTA at this time is appropriate.  I have discussed with the patient about the testing.  The patient has no IV contrast allergy and is agreeable to proceed with this test.  Sublingual nitroglycerin prescription was sent, its protocol and 911 protocol explained and the patient vocalized understanding questions were answered to the patient's satisfaction  Hyperlipidemia - continue with current statin medication.  For prediabetes continue current treatment.  She may benefit from grief counseling she plans to discuss this with her primary provider.  The patient is in agreement with the above plan. The patient left the office in stable condition.  The patient will follow up in   Medication Adjustments/Labs and Tests Ordered: Current medicines are reviewed at length with the patient today.  Concerns regarding medicines are outlined above.  Orders Placed This Encounter  Procedures   CT CORONARY MORPH W/CTA COR W/SCORE W/CA W/CM &/OR WO/CM   Basic Metabolic Panel (BMET)   Magnesium   ECHOCARDIOGRAM COMPLETE   Meds ordered this encounter  Medications   nitroGLYCERIN (NITROSTAT) 0.4 MG SL tablet    Sig: Place 1 tablet (0.4 mg total) under the tongue every 5 (five) minutes as needed for chest pain.    Dispense:  25 tablet    Refill:  2    Patient Instructions  Medication Instructions:   Nitroglycerin 0.4 mg place 1 tablet under your tongue every 5 minutes (up to three times) as needed for chest pain.   *If you need a refill on your cardiac medications before your next appointment, please call your pharmacy*   Lab Work: Your physician recommends that you have lab work  in today:  BMET Mg2+  If you have labs (blood work) drawn today and your tests are completely  normal, you will receive your results only by: MyChart Message (if you have MyChart) OR A paper copy in the mail If you have any lab test that is abnormal or we need to change your treatment, we will call you to review the results.   Testing/Procedures: Your physician has requested that you have an echocardiogram. Echocardiography is a painless test that uses sound waves to create images of your heart. It provides your doctor with information about the size and shape of your heart and how well your heart's chambers and valves are working. This procedure takes approximately one hour. There are no restrictions for this procedure. Please do NOT wear cologne, perfume, aftershave, or lotions (deodorant is allowed). Please arrive 15 minutes prior to your appointment time.    Your physician has requested that you have cardiac CT. Cardiac CT Angiography (CTA), is a special type of CT scan that uses a computer to produce multi-dimensional views of major blood vessels throughout the body. In CT angiography, a contrast material is injected through an IV to help visualize the blood vessels. A cardiac CT angiogram is a procedure to look at the heart and the area around the heart. It may be done to help find the cause of chest pains or other symptoms of heart disease. During this procedure, a substance called contrast dye is injected into a vein in the arm. The contrast highlights the blood vessels in the area to be checked. A large X-ray machine (CT scanner), then takes detailed pictures of the heart and the surrounding area. The procedure is also sometimes called a coronary CT angiogram, coronary artery scanning, or CTA.    Follow-Up: At [facility], you and your health needs are our priority.  As part of our continuing mission to provide you with exceptional heart care, we have created designated Provider  Care Teams.  These Care Teams include your primary Cardiologist (physician) and Advanced Practice Providers (APPs -  Physician Assistants and Nurse Practitioners) who all work together to provide you with the care you need, when you need it.  We recommend signing up for the patient portal called "MyChart".  Sign up information is provided on this After Visit Summary.  MyChart is used to connect with patients for Virtual Visits (Telemedicine).  Patients are able to view lab/test results, encounter notes, upcoming appointments, etc.  Non-urgent messages can be sent to your provider as well.   To learn more about what you can do with MyChart, go to [email or web address].    Your next appointment:   4 month(s)  Provider:   Berniece Salines, DO    Other Instructions   Your cardiac CT will be scheduled at one of the below locations:   [facility] [address] [place], [place] [address] [phone]  [facility] [address] [place], [place] [address] [phone]  [facility] [place],  [phone]  If scheduled at [facility], please arrive at the [facility] Entrance (Entrance C2) of [facility] 30 minutes prior to test start time. You can use the FREE valet parking offered at entrance C (encouraged to control the heart rate for the test)  Proceed to the [facility] Radiology Department (first floor) to check-in and test prep.  All radiology patients and guests should use entrance C2 at [facility], accessed from [facility], even though the hospital's  physical address listed is [address].    If scheduled at [facility] or [facility], please arrive 15 mins early for check-in and test prep.   Please follow these instructions carefully (unless  otherwise directed):  Hold all erectile dysfunction medications at least 3 days (72 hrs) prior to test. (Ie viagra, cialis, sildenafil, tadalafil, etc) We will administer nitroglycerin during this exam.   On the Night Before the Test: Be sure to Drink plenty of water. Do not consume any caffeinated/decaffeinated beverages or chocolate 12 hours prior to your test. Do not take any antihistamines 12 hours prior to your test.  On the Day of the Test: Drink plenty of water until 1 hour prior to the test. Do not eat any food 1 hour prior to test. You may take your regular medications prior to the test.  Take metoprolol (Lopressor) two hours prior to test. If you take Furosemide/Hydrochlorothiazide/Spironolactone, please HOLD on the morning of the test. FEMALES- please wear underwire-free bra if available, avoid dresses & tight clothing        After the Test: Drink plenty of water. After receiving IV contrast, you may experience a mild flushed feeling. This is normal. On occasion, you may experience a mild rash up to 24 hours after the test. This is not dangerous. If this occurs, you can take Benadryl 25 mg and increase your fluid intake. If you experience trouble breathing, this can be serious. If it is severe call 911 IMMEDIATELY. If it is mild, please call our office. If you take any of these medications: Glipizide/Metformin, Avandament, Glucavance, please do not take 48 hours after completing test unless otherwise instructed.  We will call to schedule your test 2-4 weeks out understanding that some insurance companies will need an authorization prior to the service being performed.   For non-scheduling related questions, please contact the cardiac imaging nurse navigator should you have any questions/concerns: Marchia Bond, Cardiac Imaging Nurse Navigator Gordy Clement, Cardiac Imaging Nurse Navigator [facility] Direct Office Dial: [phone]   For  scheduling needs, including cancellations and rescheduling, please call [place], [phone].    Adopting a Healthy Lifestyle.  Know what a healthy weight is for you (roughly BMI <25) and aim to maintain this   Aim for 7+ servings of fruits and vegetables daily   65-80+ fluid ounces of water or unsweet tea for healthy kidneys   Limit to max 1 drink of alcohol per day; avoid smoking/tobacco   Limit animal fats in diet for cholesterol and heart health - choose grass fed whenever available   Avoid highly processed foods, and foods high in saturated/trans fats   Aim for low stress - take time to unwind and care for your mental health   Aim for 150 min of moderate intensity exercise weekly for heart health, and weights twice weekly for bone health   Aim for 7-9 hours of sleep daily   When it comes to diets, agreement about the perfect plan isnt easy to find, even among the experts. Experts at the [employer] developed an idea known as the Healthy Eating Plate. Just imagine a plate divided into logical, healthy portions.   The emphasis is on diet quality:   Load up on vegetables and fruits - one-half of your plate: Aim for color and variety, and remember that potatoes dont count.   Go for whole grains - one-quarter of your plate: Whole wheat, barley,  wheat berries, quinoa, oats, brown rice, and foods made with them. If you want pasta, go with whole wheat pasta.   Protein power - one-quarter of your plate: Fish, chicken, beans, and nuts are all healthy, versatile protein sources. Limit red meat.   The diet, however, does go beyond the plate, offering a few other suggestions.   Use healthy plant oils, such as olive, canola, soy, corn, sunflower and peanut. Check the labels, and avoid partially hydrogenated oil, which have unhealthy trans fats.   If youre thirsty, drink water. Coffee and tea are good in moderation, but skip sugary drinks and limit milk and dairy  products to one or two daily servings.   The type of carbohydrate in the diet is more important than the amount. Some sources of carbohydrates, such as vegetables, fruits, whole grains, and beans-are healthier than others.   Finally, stay active  Signed, Berniece Salines, DO  11/07/2022 8:48 PM     [facility]

## 2022-11-05 LAB — BASIC METABOLIC PANEL
BUN/Creatinine Ratio: 20 (ref 9–23)
BUN: 14 mg/dL (ref 6–24)
CO2: 26 mmol/L (ref 20–29)
Calcium: 9.5 mg/dL (ref 8.7–10.2)
Chloride: 102 mmol/L (ref 96–106)
Creatinine, Ser: 0.7 mg/dL (ref 0.57–1.00)
Glucose: 88 mg/dL (ref 70–99)
Potassium: 3.8 mmol/L (ref 3.5–5.2)
Sodium: 141 mmol/L (ref 134–144)
eGFR: 101 mL/min/{1.73_m2} (ref >59)

## 2022-11-05 LAB — MAGNESIUM: Magnesium: 1.6 mg/dL (ref 1.6–2.3)

## 2022-11-09 ENCOUNTER — Telehealth (HOSPITAL_COMMUNITY): Payer: Self-pay | Admitting: *Deleted

## 2022-11-09 NOTE — Telephone Encounter (Signed)
Reaching out to patient to offer assistance regarding upcoming cardiac imaging study; pt verbalizes understanding of appt date/time, parking situation and where to check in, pre-test NPO status, and verified current allergies; name and call back number provided for further questions should they arise  Gordy Clement RN Navigator [facility] [phone] office [phone] cell  Patient to hold der adderall and is aware to arrive at 3:30pm.

## 2022-11-10 ENCOUNTER — Ambulatory Visit (HOSPITAL_BASED_OUTPATIENT_CLINIC_OR_DEPARTMENT_OTHER)
Admission: RE | Admit: 2022-11-10 | Discharge: 2022-11-10 | Disposition: A | Discharge status: Home / Self Care | Payer: 59 | Source: Ambulatory Visit | Attending: Cardiology | Admitting: Cardiology

## 2022-11-10 ENCOUNTER — Other Ambulatory Visit: Payer: Self-pay | Admitting: Cardiology

## 2022-11-10 ENCOUNTER — Ambulatory Visit (HOSPITAL_COMMUNITY)
Admission: RE | Admit: 2022-11-10 | Discharge: 2022-11-10 | Disposition: A | Discharge status: Home / Self Care | Payer: 59 | Source: Ambulatory Visit | Attending: Cardiology | Admitting: Cardiology

## 2022-11-10 DIAGNOSIS — I2510 Atherosclerotic heart disease of native coronary artery without angina pectoris: Secondary | ICD-10-CM

## 2022-11-10 DIAGNOSIS — R072 Precordial pain: Secondary | ICD-10-CM

## 2022-11-10 DIAGNOSIS — R931 Abnormal findings on diagnostic imaging of heart and coronary circulation: Secondary | ICD-10-CM

## 2022-11-10 MED ORDER — NITROGLYCERIN 0.4 MG SL SUBL
SUBLINGUAL_TABLET | SUBLINGUAL | Status: AC
  Filled 2022-11-10: qty 2

## 2022-11-10 MED ORDER — IOHEXOL 350 MG/ML SOLN
95.0000 mL | Freq: Once | INTRAVENOUS | Status: AC | PRN
  Administered 2022-11-10: 95 mL via INTRAVENOUS

## 2022-11-10 MED ORDER — NITROGLYCERIN 0.4 MG SL SUBL
0.8000 mg | SUBLINGUAL_TABLET | Freq: Once | SUBLINGUAL | Status: AC
  Administered 2022-11-10: 0.8 mg via SUBLINGUAL

## 2022-11-12 ENCOUNTER — Encounter: Payer: Self-pay | Admitting: Cardiology

## 2022-11-12 ENCOUNTER — Telehealth: Payer: Self-pay

## 2022-11-12 ENCOUNTER — Ambulatory Visit: Payer: 59 | Attending: Cardiology | Admitting: Cardiology

## 2022-11-12 VITALS — Ht 65.0 in | Wt 125.8 lb

## 2022-11-12 DIAGNOSIS — E782 Mixed hyperlipidemia: Secondary | ICD-10-CM

## 2022-11-12 DIAGNOSIS — I2510 Atherosclerotic heart disease of native coronary artery without angina pectoris: Secondary | ICD-10-CM | POA: Diagnosis not present

## 2022-11-12 DIAGNOSIS — Z01812 Encounter for preprocedural laboratory examination: Secondary | ICD-10-CM | POA: Diagnosis not present

## 2022-11-12 DIAGNOSIS — R931 Abnormal findings on diagnostic imaging of heart and coronary circulation: Secondary | ICD-10-CM

## 2022-11-12 DIAGNOSIS — R7303 Prediabetes: Secondary | ICD-10-CM

## 2022-11-12 NOTE — Telephone Encounter (Signed)
Called pt. Set up her cath for next week.

## 2022-11-12 NOTE — H&P (View-Only) (Signed)
 Virtual Visit via Video Note   Because of Jatara S Garin's co-morbid illnesses, she is at least at moderate risk for complications without adequate follow up.  This format is felt to be most appropriate for this patient at this time.  All issues noted in this document were discussed and addressed.  A limited physical exam was performed with this format.  Please refer to the patient's chart for her consent to telehealth for [facility].      Date:  11/12/2022   ID:  Martha [name], DOB 04/15/1966, MRN [id number]  Patient Location: Home Provider Location: Office/Clinic  PCP:  Poe, Chelsea R, FNP  Cardiologist:  Maui Ahart, DO  Electrophysiologist:  None   Evaluation Performed:  Follow-Up Visit  Chief Complaint:  " I am ok"   History of Present Illness:    Martha [name] is a 56 y.o. female with Depression , GERD, prediabetes and hyperlipidemia here today for follow-up visit.  I recently saw the patient as she complained of chest discomfort.  She was concerned about this and the symptoms were also typical so I sent her for coronary CTA.  She recently got a coronary CTA showing evidence of coronary artery disease.  She is here today to discuss the testing result.  She still is experiencing similar symptoms of chest discomfort:  The patient does not have symptoms concerning for COVID-19 infection (fever, chills, cough, or new shortness of breath).    Past Medical History:  Diagnosis Date   Abdominal pain in female 11/27/2015   ADHD    Anxiety    Arthritis    Body aches 11/27/2015   Current use of estrogen therapy 11/27/2015   Depression    GERD (gastroesophageal reflux disease)    Heart murmur    Hematuria 11/27/2015   History of depression    History of kidney stones    "must still be there."   Hot flashes due to surgical menopause 11/27/2015   Hyperlipemia 11/27/2015   Hyperlipidemia    Mental disorder    history of depression   Moody 11/27/2015   Nausea  11/27/2015   Neck pain    PTSD (post-traumatic stress disorder)    PTSD (post-traumatic stress disorder)    Vaginal itching 12/11/2015   Past Surgical History:  Procedure Laterality Date   ABDOMINAL HYSTERECTOMY     APPENDECTOMY     BACK SURGERY     CARPAL TUNNEL RELEASE Right    COLON SURGERY     COLONOSCOPY     LUMBAR FUSION     L5-S1   NECK SURGERY     x 2   TOOTH EXTRACTION N/A 11/14/2020   Procedure: DENTAL RESTORATION/EXTRACTIONS;  Surgeon: Jensen, Scott, DMD;  Location: [facility] OR;  Service: Oral Surgery;  Laterality: N/A;   UPPER GASTROINTESTINAL ENDOSCOPY     WRIST SURGERY Left    cyst removal     Current Meds  Medication Sig   amphetamine-dextroamphetamine (ADDERALL XR) 30 MG 24 hr capsule Take 30 mg by mouth every morning.   amphetamine-dextroamphetamine (ADDERALL) 10 MG tablet Take 10 mg by mouth daily.   diazepam (VALIUM) 2 MG tablet Take 2 mg by mouth 3 (three) times daily as needed for anxiety.   estradiol (ESTRACE) 1 MG tablet Take 1 tablet (1 mg total) by mouth daily.   Multiple Vitamins-Minerals (HAIR SKIN NAILS PO) Take by mouth.   Multiple Vitamins-Minerals (MULTIVITAMIN WITH MINERALS) tablet Take 1 tablet by mouth daily.   naproxen (  NAPROSYN) 375 MG tablet Take 1 tablet (375 mg total) by mouth 2 (two) times daily.   nitroGLYCERIN (NITROSTAT) 0.4 MG SL tablet Place 1 tablet (0.4 mg total) under the tongue every 5 (five) minutes as needed for chest pain.   oxybutynin (DITROPAN-XL) 5 MG 24 hr tablet Take 5 mg by mouth daily.   OZEMPIC, 0.25 OR 0.5 MG/DOSE, 2 MG/3ML SOPN Inject 0.5 mg into the skin once a week.   rOPINIRole (REQUIP) 0.25 MG tablet Take 0.25-0.5 mg by mouth at bedtime as needed and may repeat dose one time if needed (restless leg syndrome).   rosuvastatin (CRESTOR) 20 MG tablet Take 20 mg by mouth daily.   sertraline (ZOLOFT) 50 MG tablet Take 1 tablet by mouth daily.   Current Facility-Administered Medications for the 11/12/22 encounter (Video  Visit) with Baileigh Modisette, DO  Medication   0.9 %  sodium chloride infusion     Allergies:   Morphine and related, Codeine, and Latex   Social History   Tobacco Use   Smoking status: Former    Packs/day: 1.00    Years: 25.00    Additional pack years: 0.00    Total pack years: 25.00    Types: Cigarettes    Quit date: 03/11/2013    Years since quitting: 9.6   Smokeless tobacco: Never   Tobacco comments:    vapes  Vaping Use   Vaping Use: Former   Quit date: 11/26/2020  Substance Use Topics   Alcohol use: No   Drug use: No     Family Hx: The patient's family history includes Bone cancer in her mother; COPD in her sister; Cancer in an other family member; Emphysema in her father. There is no history of Colon cancer, Pancreatic cancer, Esophageal cancer, or Colon polyps.  ROS:   Please see the history of present illness.    Chest discomfort All other systems reviewed and are negative.   Prior CV studies:   The following studies were reviewed today:  CCTa 11/10/2022 FINDINGS: A 120 kV prospective scan was triggered in the descending thoracic aorta at 111 HU's. Axial non-contrast 3 mm slices were carried out through the heart. The data set was analyzed on a dedicated work station and scored using the Agatson method. Gantry rotation speed was 250 msecs and collimation was .6 mm. 0.8 mg of sl NTG was given. The 3D data set was reconstructed in 5% intervals of the 67-82 % of the R-R cycle. Diastolic phases were analyzed on a dedicated work station using MPR, MIP and VRT modes. The patient received 80 cc of contrast.   Image quality: Fair, misregistration.   Aorta:  Normal size.  No calcifications.  No dissection.   Aortic Valve: No calcifications.   Coronary Arteries:  Normal coronary origin.  Right dominance.   RCA is a large dominant artery that gives rise to PDA (small caliber) and PLA. There is diffuse mixed plaque with mid 70-99% stenosis. (FFR 0.91 to  0.67-abnormal)   Left main is a large artery that gives rise to LAD and LCX arteries.   LAD is a large vessel that has diffuse mixed plaque, mild stenosis 30-49% (FFR 0.83 distally).   D1- large sized vessel with mixed plaque   LCX is a non-dominant artery.  There is diffuse mixed plaque.   OM1-large, no significant stenosis.   Other findings:   Normal pulmonary vein drainage into the left atrium.   Normal left atrial appendage without a thrombus.   Normal   size of the pulmonary artery.   Please see radiology report for non cardiac findings.   IMPRESSION: 1. Coronary calcium score of 1366. This was 99 percentile for age and sex matched control.   2. Total plaque volume (TPV) 1030 mm3 which is 97 percentile for age-and sex matched controls (calcified plaque 234 mm3; non-calcified plaque 796 mm3). TPV is extensive.   3. Normal coronary origin with right dominance.   4. Severe mid RCA 70-99% stenosis. (FFR 0.91 to 0.67-abnormal). Mild stenosis in LAD. Diffuse mixed plaque.     Electronically Signed   By: Mark  Skains M.D.   On: 11/10/2022 17:36  CTFFR 11/10/2022 FINDINGS: FFRct analysis was performed on the original cardiac CT angiogram dataset. Diagrammatic representation of the FFRct analysis is provided in a separate PDF document in PACS. This dictation was created using the PDF document and an interactive 3D model of the results. 3D model is not available in the EMR/PACS. Normal FFR range is >0.80.   1. Left Main: Normal   2. LAD: 0.93 distal   3. LCX: Normal   4. Ramus: N/a   5. RCA: 0.91 mid, 0.67 post lesion, abnormal.   IMPRESSION: 1.  Abnormal FFR of mid RCA (0.91 to 0.67).   Note: These examples are not recommendations of HeartFlow and only provided as examples of what other customers are doing.     Electronically Signed   By: Mark  Skains M.D.   On: 11/10/2022 17:38  Labs/Other Tests and Data Reviewed:    EKG:  No ECG reviewed.  Recent  Labs: 10/16/2022: ALT 16; B Natriuretic Peptide 43.0; Hemoglobin 12.8; Platelets 160; TSH 0.757 11/04/2022: BUN 14; Creatinine, Ser 0.70; Magnesium 1.6; Potassium 3.8; Sodium 141   Recent Lipid Panel Lab Results  Component Value Date/Time   CHOL 196 12/11/2020 12:00 AM   CHOL 250 (H) 11/27/2015 11:32 AM   TRIG 179 (A) 12/11/2020 12:00 AM   HDL 79 (A) 12/11/2020 12:00 AM   HDL 78 11/27/2015 11:32 AM   CHOLHDL 3.2 11/27/2015 11:32 AM   CHOLHDL 4.9 07/07/2010 06:09 AM   LDLCALC 87 12/11/2020 12:00 AM   LDLCALC 156 (H) 11/27/2015 11:32 AM    Wt Readings from Last 3 Encounters:  11/12/22 125 lb 12.8 oz (57.1 kg)  11/04/22 130 lb (59 kg)  10/16/22 130 lb (59 kg)     Objective:    Vital Signs:  Ht 5' 5" (1.651 m)   Wt 125 lb 12.8 oz (57.1 kg)   BMI 20.93 kg/m      ASSESSMENT & PLAN:    CAD on coronary CTA , Abnormal CTFFR Hyperlipidemia Prediabetes  We discussed her testing result which showed evidence of coronary artery disease with flow-limiting lesion. Step is for the patient to undergo a left heart catheterization for potential PCI given her symptoms.  The patient understands that risks include but are not limited to stroke (1 in 1000), death (1 in 1000), kidney failure [usually temporary] (1 in 500), bleeding (1 in 200), allergic reaction [possibly serious] (1 in 200), and agrees to proceed.  Start aspirin 81 mg daily.  Continue rosuvastatin.  Follow-up 2 to 4 weeks post LHC.  Hyperlipidemia - continue with current statin medication.   COVID-19 Education: The signs and symptoms of COVID-19 were discussed with the patient and how to seek care for testing (follow up with PCP or arrange E-visit).  The importance of social distancing was discussed today.  Time:   Today, I have spent 15 minutes   with the patient with telehealth technology discussing the above problems.     Medication Adjustments/Labs and Tests Ordered: Current medicines are reviewed at length with the  patient today.  Concerns regarding medicines are outlined above.   Tests Ordered: Orders Placed This Encounter  Procedures   CBC with Differential/Platelet    Medication Changes: No orders of the defined types were placed in this encounter.   Follow Up:  In Person in 4 week(s)  Signed, Lisl Slingerland, DO  11/12/2022 10:41 PM    [facility]  

## 2022-11-12 NOTE — Patient Instructions (Addendum)
Medication Instructions:  Your physician recommends that you continue on your current medications as directed. Please refer to the Current Medication list given to you today.  *If you need a refill on your cardiac medications before your next appointment, please call your pharmacy*   Lab Work: None   Testing/Procedures:  You are scheduled for a Cardiac Catheterization on Tuesday, April 9 with Dr. Bryan Lemma.  1. Please arrive at the [facility] (Main Entrance A) at [facility]: [address] [place], [place] [address] at 8:30 AM (This time is two hours before your procedure to ensure your preparation). Free valet parking service is available.   Special note: Every effort is made to have your procedure done on time. Please understand that emergencies sometimes delay scheduled procedures.  2. Diet: Do not eat solid foods after midnight.  The patient may have clear liquids until 5am upon the day of the procedure.  3. Labs: You had labs drawn on March 28th  4. Medication instructions in preparation for your procedure:   Contrast Allergy: No  On the morning of your procedure, take your Aspirin 81 mg and any morning medicines NOT listed above.  You may use sips of water.  5. Plan to go home the same day, you will only stay overnight if medically necessary. 6. Bring a current list of your medications and current insurance cards. 7. You MUST have a responsible person to drive you home. 8. Someone MUST be with you the first 24 hours after you arrive home or your discharge will be delayed. 9. Please wear clothes that are easy to get on and off and wear slip-on shoes.  Thank you for allowing [place] to care for you!   -- [facility]   Follow-Up: At [facility], you and your health needs are our priority.  As part of our continuing mission to provide you with exceptional heart care, we have created designated Provider Care Teams.  These Care  Teams include your primary Cardiologist (physician) and Advanced Practice Providers (APPs -  Physician Assistants and Nurse Practitioners) who all work together to provide you with the care you need, when you need it.  Your next appointment:   April 15th at 9:40am  Provider:   Thomasene Ripple, DO

## 2022-11-12 NOTE — Progress Notes (Signed)
Virtual Visit via Video Note   Because of Martha [name]'s co-morbid illnesses, she is at least at moderate risk for complications without adequate follow up.  This format is felt to be most appropriate for this patient at this time.  All issues noted in this document were discussed and addressed.  A limited physical exam was performed with this format.  Please refer to the patient's chart for her consent to telehealth for [facility].      Date:  11/12/2022   ID:  Martha MentionPenny S [name], DOB 1966-01-07, MRN [id number]  Patient Location: Home Provider Location: Office/Clinic  PCP:  Jerrye BushyPoe, Chelsea R, FNP  Cardiologist:  Thomasene RippleKardie Jenika Chiem, DO  Electrophysiologist:  None   Evaluation Performed:  Follow-Up Visit  Chief Complaint:  " I am ok"   History of Present Illness:    Martha Mentionenny S [name] is a 57 y.o. female with Depression , GERD, prediabetes and hyperlipidemia here today for follow-up visit.  I recently saw the patient as she complained of chest discomfort.  She was concerned about this and the symptoms were also typical so I sent her for coronary CTA.  She recently got a coronary CTA showing evidence of coronary artery disease.  She is here today to discuss the testing result.  She still is experiencing similar symptoms of chest discomfort:  The patient does not have symptoms concerning for COVID-19 infection (fever, chills, cough, or new shortness of breath).    Past Medical History:  Diagnosis Date   Abdominal pain in female 11/27/2015   ADHD    Anxiety    Arthritis    Body aches 11/27/2015   Current use of estrogen therapy 11/27/2015   Depression    GERD (gastroesophageal reflux disease)    Heart murmur    Hematuria 11/27/2015   History of depression    History of kidney stones    "must still be there."   Hot flashes due to surgical menopause 11/27/2015   Hyperlipemia 11/27/2015   Hyperlipidemia    Mental disorder    history of depression   Moody 11/27/2015   Nausea  11/27/2015   Neck pain    PTSD (post-traumatic stress disorder)    PTSD (post-traumatic stress disorder)    Vaginal itching 12/11/2015   Past Surgical History:  Procedure Laterality Date   ABDOMINAL HYSTERECTOMY     APPENDECTOMY     BACK SURGERY     CARPAL TUNNEL RELEASE Right    COLON SURGERY     COLONOSCOPY     LUMBAR FUSION     L5-S1   NECK SURGERY     x 2   TOOTH EXTRACTION N/A 11/14/2020   Procedure: DENTAL RESTORATION/EXTRACTIONS;  Surgeon: Ocie DoyneJensen, Scott, DMD;  Location: [facility] OR;  Service: Oral Surgery;  Laterality: N/A;   UPPER GASTROINTESTINAL ENDOSCOPY     WRIST SURGERY Left    cyst removal     Current Meds  Medication Sig   amphetamine-dextroamphetamine (ADDERALL XR) 30 MG 24 hr capsule Take 30 mg by mouth every morning.   amphetamine-dextroamphetamine (ADDERALL) 10 MG tablet Take 10 mg by mouth daily.   diazepam (VALIUM) 2 MG tablet Take 2 mg by mouth 3 (three) times daily as needed for anxiety.   estradiol (ESTRACE) 1 MG tablet Take 1 tablet (1 mg total) by mouth daily.   Multiple Vitamins-Minerals (HAIR SKIN NAILS PO) Take by mouth.   Multiple Vitamins-Minerals (MULTIVITAMIN WITH MINERALS) tablet Take 1 tablet by mouth daily.   naproxen (  NAPROSYN) 375 MG tablet Take 1 tablet (375 mg total) by mouth 2 (two) times daily.   nitroGLYCERIN (NITROSTAT) 0.4 MG SL tablet Place 1 tablet (0.4 mg total) under the tongue every 5 (five) minutes as needed for chest pain.   oxybutynin (DITROPAN-XL) 5 MG 24 hr tablet Take 5 mg by mouth daily.   OZEMPIC, 0.25 OR 0.5 MG/DOSE, 2 MG/3ML SOPN Inject 0.5 mg into the skin once a week.   rOPINIRole (REQUIP) 0.25 MG tablet Take 0.25-0.5 mg by mouth at bedtime as needed and may repeat dose one time if needed (restless leg syndrome).   rosuvastatin (CRESTOR) 20 MG tablet Take 20 mg by mouth daily.   sertraline (ZOLOFT) 50 MG tablet Take 1 tablet by mouth daily.   Current Facility-Administered Medications for the 11/12/22 encounter (Video  Visit) with Dariona Postma, DO  Medication   0.9 %  sodium chloride infusion     Allergies:   Morphine and related, Codeine, and Latex   Social History   Tobacco Use   Smoking status: Former    Packs/day: 1.00    Years: 25.00    Additional pack years: 0.00    Total pack years: 25.00    Types: Cigarettes    Quit date: 03/11/2013    Years since quitting: 9.6   Smokeless tobacco: Never   Tobacco comments:    vapes  Vaping Use   Vaping Use: Former   Quit date: 11/26/2020  Substance Use Topics   Alcohol use: No   Drug use: No     Family Hx: The patient's family history includes Bone cancer in her mother; COPD in her sister; Cancer in an other family member; Emphysema in her father. There is no history of Colon cancer, Pancreatic cancer, Esophageal cancer, or Colon polyps.  ROS:   Please see the history of present illness.    Chest discomfort All other systems reviewed and are negative.   Prior CV studies:   The following studies were reviewed today:  CCTa 11/10/2022 FINDINGS: A 120 kV prospective scan was triggered in the descending thoracic aorta at 111 HU's. Axial non-contrast 3 mm slices were carried out through the heart. The data set was analyzed on a dedicated work station and scored using the Agatson method. Gantry rotation speed was 250 msecs and collimation was .6 mm. 0.8 mg of sl NTG was given. The 3D data set was reconstructed in 5% intervals of the 67-82 % of the R-R cycle. Diastolic phases were analyzed on a dedicated work station using MPR, MIP and VRT modes. The patient received 80 cc of contrast.   Image quality: Fair, misregistration.   Aorta:  Normal size.  No calcifications.  No dissection.   Aortic Valve: No calcifications.   Coronary Arteries:  Normal coronary origin.  Right dominance.   RCA is a large dominant artery that gives rise to PDA (small caliber) and PLA. There is diffuse mixed plaque with mid 70-99% stenosis. (FFR 0.91 to  0.67-abnormal)   Left main is a large artery that gives rise to LAD and LCX arteries.   LAD is a large vessel that has diffuse mixed plaque, mild stenosis 30-49% (FFR 0.83 distally).   D1- large sized vessel with mixed plaque   LCX is a non-dominant artery.  There is diffuse mixed plaque.   OM1-large, no significant stenosis.   Other findings:   Normal pulmonary vein drainage into the left atrium.   Normal left atrial appendage without a thrombus.   Normal  size of the pulmonary artery.   Please see radiology report for non cardiac findings.   IMPRESSION: 1. Coronary calcium score of 1366. This was 51 percentile for age and sex matched control.   2. Total plaque volume (TPV) 1030 mm3 which is 97 percentile for age-and sex matched controls (calcified plaque 234 mm3; non-calcified plaque 796 mm3). TPV is extensive.   3. Normal coronary origin with right dominance.   4. Severe mid RCA 70-99% stenosis. (FFR 0.91 to 0.67-abnormal). Mild stenosis in LAD. Diffuse mixed plaque.     Electronically Signed   By: Donato Schultz M.D.   On: 11/10/2022 17:36  CTFFR 11/10/2022 FINDINGS: FFRct analysis was performed on the original cardiac CT angiogram dataset. Diagrammatic representation of the FFRct analysis is provided in a separate PDF document in PACS. This dictation was created using the PDF document and an interactive 3D model of the results. 3D model is not available in the EMR/PACS. Normal FFR range is >0.80.   1. Left Main: Normal   2. LAD: 0.93 distal   3. LCX: Normal   4. Ramus: N/a   5. RCA: 0.91 mid, 0.67 post lesion, abnormal.   IMPRESSION: 1.  Abnormal FFR of mid RCA (0.91 to 0.67).   Note: These examples are not recommendations of HeartFlow and only provided as examples of what other customers are doing.     Electronically Signed   By: Donato Schultz M.D.   On: 11/10/2022 17:38  Labs/Other Tests and Data Reviewed:    EKG:  No ECG reviewed.  Recent  Labs: 10/16/2022: ALT 16; B Natriuretic Peptide 43.0; Hemoglobin 12.8; Platelets 160; TSH 0.757 11/04/2022: BUN 14; Creatinine, Ser 0.70; Magnesium 1.6; Potassium 3.8; Sodium 141   Recent Lipid Panel Lab Results  Component Value Date/Time   CHOL 196 12/11/2020 12:00 AM   CHOL 250 (H) 11/27/2015 11:32 AM   TRIG 179 (A) 12/11/2020 12:00 AM   HDL 79 (A) 12/11/2020 12:00 AM   HDL 78 11/27/2015 11:32 AM   CHOLHDL 3.2 11/27/2015 11:32 AM   CHOLHDL 4.9 07/07/2010 06:09 AM   LDLCALC 87 12/11/2020 12:00 AM   LDLCALC 156 (H) 11/27/2015 11:32 AM    Wt Readings from Last 3 Encounters:  11/12/22 125 lb 12.8 oz (57.1 kg)  11/04/22 130 lb (59 kg)  10/16/22 130 lb (59 kg)     Objective:    Vital Signs:  Ht 5\' 5"  (1.651 m)   Wt 125 lb 12.8 oz (57.1 kg)   BMI 20.93 kg/m      ASSESSMENT & PLAN:    CAD on coronary CTA , Abnormal CTFFR Hyperlipidemia Prediabetes  We discussed her testing result which showed evidence of coronary artery disease with flow-limiting lesion. Step is for the patient to undergo a left heart catheterization for potential PCI given her symptoms.  The patient understands that risks include but are not limited to stroke (1 in 1000), death (1 in 1000), kidney failure [usually temporary] (1 in 500), bleeding (1 in 200), allergic reaction [possibly serious] (1 in 200), and agrees to proceed.  Start aspirin 81 mg daily.  Continue rosuvastatin.  Follow-up 2 to 4 weeks post LHC.  Hyperlipidemia - continue with current statin medication.   COVID-19 Education: The signs and symptoms of COVID-19 were discussed with the patient and how to seek care for testing (follow up with PCP or arrange E-visit).  The importance of social distancing was discussed today.  Time:   Today, I have spent 15 minutes  with the patient with telehealth technology discussing the above problems.     Medication Adjustments/Labs and Tests Ordered: Current medicines are reviewed at length with the  patient today.  Concerns regarding medicines are outlined above.   Tests Ordered: Orders Placed This Encounter  Procedures   CBC with Differential/Platelet    Medication Changes: No orders of the defined types were placed in this encounter.   Follow Up:  In Person in 4 week(s)  Signed, Thomasene RippleKardie Terika Pillard, DO  11/12/2022 10:41 PM    [facility]

## 2022-11-15 ENCOUNTER — Telehealth: Payer: Self-pay | Admitting: *Deleted

## 2022-11-15 NOTE — Telephone Encounter (Signed)
Cardiac Catheterization scheduled at [facility] for:  Tuesday November 16, 2022 10:30 AM Arrival time [facility] Main Entrance A at: 8 AM-needs CBC  Nothing to eat after midnight prior to procedure, clear liquids until 5 AM day of procedure.  Medication instructions: -Hold:  Ozempic-weekly on Tuesdays-will hold until post procedure -Other usual morning medications can be taken with sips of water including aspirin 81 mg.  Confirmed patient has responsible adult to drive home post procedure and be with patient first 24 hours after arriving home.  Plan to go home the same day, you will only stay overnight if medically necessary.  Reviewed procedure instructions with patient.

## 2022-11-16 ENCOUNTER — Other Ambulatory Visit (HOSPITAL_COMMUNITY): Payer: Self-pay

## 2022-11-16 ENCOUNTER — Other Ambulatory Visit: Payer: Self-pay

## 2022-11-16 ENCOUNTER — Encounter (HOSPITAL_COMMUNITY): Payer: Self-pay | Admitting: Cardiology

## 2022-11-16 ENCOUNTER — Ambulatory Visit (HOSPITAL_COMMUNITY)
Admission: RE | Admit: 2022-11-16 | Discharge: 2022-11-16 | Disposition: A | Discharge status: Home / Self Care | Payer: 59 | Attending: Cardiology | Admitting: Cardiology

## 2022-11-16 ENCOUNTER — Encounter (HOSPITAL_COMMUNITY)
Admission: RE | Disposition: A | Discharge status: Home / Self Care | Payer: Self-pay | Source: Home / Self Care | Attending: Cardiology

## 2022-11-16 DIAGNOSIS — I25119 Atherosclerotic heart disease of native coronary artery with unspecified angina pectoris: Secondary | ICD-10-CM | POA: Insufficient documentation

## 2022-11-16 DIAGNOSIS — Z01812 Encounter for preprocedural laboratory examination: Secondary | ICD-10-CM

## 2022-11-16 DIAGNOSIS — Z79899 Other long term (current) drug therapy: Secondary | ICD-10-CM | POA: Diagnosis not present

## 2022-11-16 DIAGNOSIS — I2510 Atherosclerotic heart disease of native coronary artery without angina pectoris: Secondary | ICD-10-CM | POA: Diagnosis not present

## 2022-11-16 DIAGNOSIS — K219 Gastro-esophageal reflux disease without esophagitis: Secondary | ICD-10-CM | POA: Diagnosis not present

## 2022-11-16 DIAGNOSIS — I209 Angina pectoris, unspecified: Secondary | ICD-10-CM

## 2022-11-16 DIAGNOSIS — E785 Hyperlipidemia, unspecified: Secondary | ICD-10-CM | POA: Diagnosis not present

## 2022-11-16 DIAGNOSIS — R7303 Prediabetes: Secondary | ICD-10-CM | POA: Diagnosis not present

## 2022-11-16 DIAGNOSIS — R931 Abnormal findings on diagnostic imaging of heart and coronary circulation: Secondary | ICD-10-CM

## 2022-11-16 DIAGNOSIS — F32A Depression, unspecified: Secondary | ICD-10-CM | POA: Insufficient documentation

## 2022-11-16 DIAGNOSIS — Z955 Presence of coronary angioplasty implant and graft: Secondary | ICD-10-CM | POA: Diagnosis not present

## 2022-11-16 HISTORY — PX: CORONARY STENT INTERVENTION: CATH118234

## 2022-11-16 HISTORY — PX: LEFT HEART CATH AND CORONARY ANGIOGRAPHY: CATH118249

## 2022-11-16 LAB — CBC
HCT: 39.5 % (ref 36.0–46.0)
Hemoglobin: 13.5 g/dL (ref 12.0–15.0)
MCH: 30.9 pg (ref 26.0–34.0)
MCHC: 34.2 g/dL (ref 30.0–36.0)
MCV: 90.4 fL (ref 80.0–100.0)
Platelets: 162 10*3/uL (ref 150–400)
RBC: 4.37 MIL/uL (ref 3.87–5.11)
RDW: 11.9 % (ref 11.5–15.5)
WBC: 6.6 10*3/uL (ref 4.0–10.5)
nRBC: 0 % (ref 0.0–0.2)

## 2022-11-16 LAB — POCT ACTIVATED CLOTTING TIME
Activated Clotting Time: 244 seconds
Activated Clotting Time: 342 seconds

## 2022-11-16 SURGERY — LEFT HEART CATH AND CORONARY ANGIOGRAPHY
Anesthesia: LOCAL

## 2022-11-16 MED ORDER — SODIUM CHLORIDE 0.9% FLUSH: 3.0000 mL | Freq: Two times a day (BID) | INTRAVENOUS | Status: DC

## 2022-11-16 MED ORDER — LIDOCAINE HCL (PF) 1 % IJ SOLN
INTRAMUSCULAR | Status: AC
  Filled 2022-11-16: qty 30

## 2022-11-16 MED ORDER — SODIUM CHLORIDE 0.9% FLUSH: 3.0000 mL | INTRAVENOUS | Status: DC | PRN

## 2022-11-16 MED ORDER — NITROGLYCERIN 1 MG/10 ML FOR IR/CATH LAB
INTRA_ARTERIAL | Status: AC
  Filled 2022-11-16: qty 10

## 2022-11-16 MED ORDER — ROSUVASTATIN CALCIUM 40 MG PO TABS
20.0000 mg | ORAL_TABLET | Freq: Every day | ORAL | 3 refills | Status: DC
  Filled 2022-11-16: qty 45, 90d supply, fill #0

## 2022-11-16 MED ORDER — ACETAMINOPHEN 325 MG PO TABS: 650.0000 mg | ORAL_TABLET | ORAL | Status: DC | PRN

## 2022-11-16 MED ORDER — FAMOTIDINE IN NACL 20-0.9 MG/50ML-% IV SOLN
INTRAVENOUS | Status: AC
  Filled 2022-11-16: qty 50

## 2022-11-16 MED ORDER — CLOPIDOGREL BISULFATE 75 MG PO TABS: 75.0000 mg | ORAL_TABLET | Freq: Every day | ORAL | Status: DC

## 2022-11-16 MED ORDER — NITROGLYCERIN 1 MG/10 ML FOR IR/CATH LAB
INTRA_ARTERIAL | Status: DC | PRN
  Administered 2022-11-16: 200 ug via INTRACORONARY

## 2022-11-16 MED ORDER — METOPROLOL TARTRATE 25 MG PO TABS: 25.0000 mg | ORAL_TABLET | Freq: Two times a day (BID) | ORAL | Status: DC

## 2022-11-16 MED ORDER — CLOPIDOGREL BISULFATE 75 MG PO TABS
75.0000 mg | ORAL_TABLET | Freq: Every day | ORAL | 3 refills | Status: DC
  Filled 2022-11-16: qty 90, 90d supply, fill #0

## 2022-11-16 MED ORDER — MIDAZOLAM HCL 2 MG/2ML IJ SOLN
INTRAMUSCULAR | Status: DC | PRN
  Administered 2022-11-16 (×2): 1 mg via INTRAVENOUS

## 2022-11-16 MED ORDER — CLOPIDOGREL BISULFATE 300 MG PO TABS
ORAL_TABLET | ORAL | Status: DC | PRN
  Administered 2022-11-16: 600 mg via ORAL

## 2022-11-16 MED ORDER — ONDANSETRON HCL 4 MG/2ML IJ SOLN: 4.0000 mg | Freq: Four times a day (QID) | INTRAMUSCULAR | Status: DC | PRN

## 2022-11-16 MED ORDER — MIDAZOLAM HCL 2 MG/2ML IJ SOLN
INTRAMUSCULAR | Status: AC
  Filled 2022-11-16: qty 2

## 2022-11-16 MED ORDER — IOHEXOL 350 MG/ML SOLN
INTRAVENOUS | Status: DC | PRN
  Administered 2022-11-16: 110 mL

## 2022-11-16 MED ORDER — HEPARIN (PORCINE) IN NACL 1000-0.9 UT/500ML-% IV SOLN
INTRAVENOUS | Status: DC | PRN
  Administered 2022-11-16 (×2): 500 mL

## 2022-11-16 MED ORDER — HEPARIN SODIUM (PORCINE) 1000 UNIT/ML IJ SOLN
INTRAMUSCULAR | Status: DC | PRN
  Administered 2022-11-16: 3000 [IU] via INTRAVENOUS
  Administered 2022-11-16: 2000 [IU] via INTRAVENOUS
  Administered 2022-11-16: 3000 [IU] via INTRAVENOUS

## 2022-11-16 MED ORDER — METOPROLOL TARTRATE 25 MG PO TABS
25.0000 mg | ORAL_TABLET | Freq: Two times a day (BID) | ORAL | 11 refills | Status: DC
  Filled 2022-11-16: qty 60, 30d supply, fill #0

## 2022-11-16 MED ORDER — SODIUM CHLORIDE 0.9 % WEIGHT BASED INFUSION
3.0000 mL/kg/h | INTRAVENOUS | Status: DC
  Administered 2022-11-16: 3 mL/kg/h via INTRAVENOUS

## 2022-11-16 MED ORDER — SODIUM CHLORIDE 0.9 % IV SOLN: 250.0000 mL | INTRAVENOUS | Status: DC | PRN

## 2022-11-16 MED ORDER — LABETALOL HCL 5 MG/ML IV SOLN: 10.0000 mg | INTRAVENOUS | Status: DC | PRN

## 2022-11-16 MED ORDER — HEPARIN SODIUM (PORCINE) 1000 UNIT/ML IJ SOLN
INTRAMUSCULAR | Status: AC
  Filled 2022-11-16: qty 10

## 2022-11-16 MED ORDER — SODIUM CHLORIDE 0.9 % WEIGHT BASED INFUSION
1.0000 mL/kg/h | INTRAVENOUS | Status: DC
  Administered 2022-11-16: 250 mL via INTRAVENOUS

## 2022-11-16 MED ORDER — SODIUM CHLORIDE 0.9 % WEIGHT BASED INFUSION: 1.0000 mL/kg/h | INTRAVENOUS | Status: DC

## 2022-11-16 MED ORDER — FAMOTIDINE IN NACL 20-0.9 MG/50ML-% IV SOLN
INTRAVENOUS | Status: DC | PRN
  Administered 2022-11-16: 20 mg via INTRAVENOUS

## 2022-11-16 MED ORDER — VERAPAMIL HCL 2.5 MG/ML IV SOLN
INTRAVENOUS | Status: DC | PRN
  Administered 2022-11-16: 10 mL via INTRA_ARTERIAL

## 2022-11-16 MED ORDER — FENTANYL CITRATE (PF) 100 MCG/2ML IJ SOLN
INTRAMUSCULAR | Status: DC | PRN
  Administered 2022-11-16 (×4): 25 ug via INTRAVENOUS

## 2022-11-16 MED ORDER — HYDRALAZINE HCL 20 MG/ML IJ SOLN: 10.0000 mg | INTRAMUSCULAR | Status: DC | PRN

## 2022-11-16 MED ORDER — LIDOCAINE HCL (PF) 1 % IJ SOLN
INTRAMUSCULAR | Status: DC | PRN
  Administered 2022-11-16: 3 mL

## 2022-11-16 MED ORDER — ASPIRIN 81 MG PO CHEW: 81.0000 mg | CHEWABLE_TABLET | ORAL | Status: DC

## 2022-11-16 MED ORDER — ROSUVASTATIN CALCIUM 40 MG PO TABS
40.0000 mg | ORAL_TABLET | Freq: Every day | ORAL | 3 refills | Status: DC
  Filled 2022-11-16: qty 90, 90d supply, fill #0

## 2022-11-16 MED ORDER — VERAPAMIL HCL 2.5 MG/ML IV SOLN
INTRAVENOUS | Status: AC
  Filled 2022-11-16: qty 2

## 2022-11-16 MED ORDER — ASPIRIN 81 MG PO TBEC
81.0000 mg | DELAYED_RELEASE_TABLET | Freq: Every day | ORAL | 3 refills | Status: DC
  Filled 2022-11-16: qty 90, 90d supply, fill #0

## 2022-11-16 MED ORDER — FENTANYL CITRATE (PF) 100 MCG/2ML IJ SOLN
INTRAMUSCULAR | Status: AC
  Filled 2022-11-16: qty 2

## 2022-11-16 SURGICAL SUPPLY — 21 items
BALLN EMERGE MR 2.5X12 (BALLOONS) ×1
BALLN ~~LOC~~ EMERGE MR 3.0X12 (BALLOONS) ×1
BALLOON EMERGE MR 2.5X12 (BALLOONS) IMPLANT
BALLOON ~~LOC~~ EMERGE MR 3.0X12 (BALLOONS) IMPLANT
CATH INFINITI 5FR ANG PIGTAIL (CATHETERS) IMPLANT
CATH LAUNCHER 6FR JR4 (CATHETERS) IMPLANT
CATH OPTITORQUE TIG 4.0 5F (CATHETERS) IMPLANT
DEVICE RAD TR BAND REGULAR (VASCULAR PRODUCTS) IMPLANT
ELECT DEFIB PAD ADLT CADENCE (PAD) IMPLANT
GLIDESHEATH SLEND SS 6F .021 (SHEATH) IMPLANT
GUIDEWIRE INQWIRE 1.5J.035X260 (WIRE) IMPLANT
INQWIRE 1.5J .035X260CM (WIRE) ×1
KIT ENCORE 26 ADVANTAGE (KITS) IMPLANT
KIT HEART LEFT (KITS) ×1 IMPLANT
PACK CARDIAC CATHETERIZATION (CUSTOM PROCEDURE TRAY) ×1 IMPLANT
SHEATH PROBE COVER 6X72 (BAG) IMPLANT
STENT SYNERGY XD 2.75X16 (Permanent Stent) IMPLANT
SYNERGY XD 2.75X16 (Permanent Stent) ×1 IMPLANT
TRANSDUCER W/STOPCOCK (MISCELLANEOUS) ×1 IMPLANT
TUBING CIL FLEX 10 FLL-RA (TUBING) ×1 IMPLANT
WIRE ASAHI PROWATER 180CM (WIRE) IMPLANT

## 2022-11-16 NOTE — Progress Notes (Signed)
1250 small hematoma reappeared, pressure held for 5 more min, arm elevation increased. Will resume air removal at 1315, will continue to monitor

## 2022-11-16 NOTE — Interval H&P Note (Signed)
History and Physical Interval Note:  11/16/2022 10:26 AM  Martha [name]  has presented today for surgery, with the diagnosis of CAD- Abnormal Coronary CT Angio.   The various methods of treatment have been discussed with the patient and family. After consideration of risks, benefits and other options for treatment, the patient has consented to  Procedure(s): LEFT HEART CATH AND CORONARY ANGIOGRAPHY (N/A)  PERCUTANEOUS CORONARY INTERVENTION  as a surgical intervention.  The patient's history has been reviewed, patient examined, no change in status, stable for surgery.  I have reviewed the patient's chart and labs.  Questions were answered to the patient's satisfaction.    Cath Lab Visit (complete for each Cath Lab visit)  Clinical Evaluation Leading to the Procedure:   ACS: No.  Non-ACS:    Anginal Classification: CCS IV  Anti-ischemic medical therapy: No Therapy  Non-Invasive Test Results: High-risk stress test findings: cardiac mortality >3%/year  Prior CABG: No previous CABG   Bryan Lemma

## 2022-11-16 NOTE — Progress Notes (Signed)
Pt was educated on stent card, stent location, Plavix and ASA use, wt restrictions, no baths/daily wash-ups, s/s of infection, ex guidelines (progressive walking and resistance exercise), s/s to stop exercising, NTG use and calling 911, heart healthy diet, diabetic diet, risk factors (uses THC vape pen, High A1C), and CRPII. Pt received materials on exercise, diet, and CRPII. Will refer to AP.

## 2022-11-16 NOTE — Progress Notes (Signed)
Hematoma appeared after 3 cc air removed from band, pressure held to site for 10 minutes, hematoma resolved

## 2022-11-16 NOTE — Discharge Instructions (Signed)

## 2022-11-16 NOTE — Progress Notes (Signed)
TR BAND REMOVAL  LOCATION:    right radial  DEFLATED PER PROTOCOL:    Yes.    TIME BAND OFF / DRESSING APPLIED:    1400 gauze dressing applied   SITE UPON ARRIVAL:    Level 0  SITE AFTER BAND REMOVAL:    Level 0  CIRCULATION SENSATION AND MOVEMENT:    Within Normal Limits   Yes.    COMMENTS:   no new issues noted

## 2022-11-16 NOTE — Discharge Summary (Signed)
Discharge Summary for Same Day PCI   Patient ID: Martha [name] MRN: [id number]; DOB: 05-31-1966  Admit date: 11/16/2022 Discharge date: 11/16/2022  Primary Care Provider: Jerrye Bushy, FNP  Primary Cardiologist: Thomasene Ripple, DO  Primary Electrophysiologist:  None   Discharge Diagnoses    Active Problems:   Angina, class III   Abnormal cardiac CT angiography  Diagnostic Studies/Procedures    Cardiac Catheterization 11/16/2022:    Ost RCA to Prox RCA lesion is 20% stenosed.  Prox RCA lesion is 30% stenosed.   Prox RCA to Mid RCA lesion is 85% stenosed.   A drug-eluting stent was successfully placed using a SYNERGY XD 2.75X16 => deployed to 3.0 mm and postdilated to 3.1 mm proximally. Post intervention, there is a 0% residual stenosis.  Reduced to 0%   Mid RCA lesion is 30% stenosed.   ----------------------------------------------   Essentially normal Left Coronary System.   ----------------------------------------------   The left ventricular systolic function is normal.  The left ventricular ejection fraction is 55-65% by visual estimate.   LV end diastolic pressure is normal.   There is no aortic valve stenosis.   POST-CATH DIAGNOSES Severe 1 Vessel CAD as noted on Cardiac CT Angioraphy - 85% mid RCA (TIMI 3 flow) - there are several areas of ~20-30% stenosis in the ostial-proximal & distal vessel (Not significant by CTFFR) =>  Successful DES PCI of mid RCA (Synergy XD 2.75 mm 16 mm deployed to 3.0 mm, postdilated to 3.1 mm proximally. TIMI-3 flow preserved, reduced to 0%. Minimal disease in the Left Coronary system. Normal LV size and function. EF estimated 55 to 65% with normal LVEDP. No RWMA    RECOMMENDATIONS Anticipate same-day discharge with type a lesion in the mid RCA and successful PCI. ASA-Plavix DAPT for 6 months minimum.  Would continue Plavix monotherapy afterwards. Add metoprolol 25 mg twice daily Titrate statin to 40 mg daily. Follow-up with Dr. Servando Salina or  APP.  Diagnostic Dominance: Right  Intervention   _____________   History of Present Illness     Martha [name] is a 56 y.o. female with PMH of GERD, preDM, HLD who presented to the office with Dr. Servando Salina with complaints of chest pain. Underwent outpatient coronary CTA which showed Ca+ score of 1366 which was 99% percentile with severe mRCA 70-99% with abnormal FFR, mild stenosis. Given this finding she was set up for outpatient cardiac cath.    Cardiac catheterization was arranged for further evaluation.  Hospital Course     The patient underwent cardiac cath as noted above with severe single vessel CAD of 85% treated with PCI/DES x1. Normal LVEF. Plan for DAPT with ASA/plavix for at least 6 months. The patient was seen by cardiac rehab while in short stay. There were no observed complications post cath. Radial cath site was re-evaluated prior to discharge and found to be stable without any complications. Instructions/precautions regarding cath site care were given prior to discharge.  Martha [name] was seen by Dr. Herbie Baltimore and determined stable for discharge home. Follow up with our office has been arranged. Medications are listed below. Pertinent changes include addition of plavix, increased Crestor to 40 mg daily and added Toprol 25mg  daily.  _____________  Cath/PCI Registry Performance & Quality Measures: Aspirin prescribed? - Yes ADP Receptor Inhibitor (Plavix/Clopidogrel, Brilinta/Ticagrelor or Effient/Prasugrel) prescribed (includes medically managed patients)? - Yes High Intensity Statin (Lipitor 40-80mg  or Crestor 20-40mg ) prescribed? - Yes For EF <40%, was ACEI/ARB prescribed? - Not Applicable (EF >/=  40%) For EF <40%, Aldosterone Antagonist (Spironolactone or Eplerenone) prescribed? - Not Applicable (EF >/= 40%) Cardiac Rehab Phase II ordered (Included Medically managed Patients)? - Yes  _____________   Discharge Vitals Blood pressure 122/71, pulse 60, temperature 98.2  F (36.8 C), temperature source Oral, resp. rate 10, height 5\' 5"  (1.651 m), weight 57.2 kg, SpO2 99 %.  Filed Weights   11/16/22 0840  Weight: 57.2 kg    Last Labs & Radiologic Studies    CBC Recent Labs    11/16/22 0853  WBC 6.6  HGB 13.5  HCT 39.5  MCV 90.4  PLT 162   Basic Metabolic Panel No results for input(s): "NA", "K", "CL", "CO2", "GLUCOSE", "BUN", "CREATININE", "CALCIUM", "MG", "PHOS" in the last 72 hours. Liver Function Tests No results for input(s): "AST", "ALT", "ALKPHOS", "BILITOT", "PROT", "ALBUMIN" in the last 72 hours. No results for input(s): "LIPASE", "AMYLASE" in the last 72 hours. High Sensitivity Troponin:   No results for input(s): "TROPONINIHS" in the last 720 hours.  BNP Invalid input(s): "POCBNP" D-Dimer No results for input(s): "DDIMER" in the last 72 hours. Hemoglobin A1C No results for input(s): "HGBA1C" in the last 72 hours. Fasting Lipid Panel No results for input(s): "CHOL", "HDL", "LDLCALC", "TRIG", "CHOLHDL", "LDLDIRECT" in the last 72 hours. Thyroid Function Tests No results for input(s): "TSH", "T4TOTAL", "T3FREE", "THYROIDAB" in the last 72 hours.  Invalid input(s): "FREET3" _____________  CARDIAC CATHETERIZATION  Result Date: 11/16/2022   Ost RCA to Prox RCA lesion is 20% stenosed.  Prox RCA lesion is 30% stenosed.   Prox RCA to Mid RCA lesion is 85% stenosed.   A drug-eluting stent was successfully placed using a SYNERGY XD 2.75X16 => deployed to 3.0 mm and postdilated to 3.1 mm proximally. Post intervention, there is a 0% residual stenosis.  Reduced to 0%   Mid RCA lesion is 30% stenosed.   ----------------------------------------------   Essentially normal Left Coronary System.   ----------------------------------------------   The left ventricular systolic function is normal.  The left ventricular ejection fraction is 55-65% by visual estimate.   LV end diastolic pressure is normal.   There is no aortic valve stenosis. POST-CATH  DIAGNOSES Severe 1 Vessel CAD as noted on Cardiac CT Angioraphy - 85% mid RCA (TIMI 3 flow) - there are several areas of ~20-30% stenosis in the ostial-proximal & distal vessel (Not significant by CTFFR) => Successful DES PCI of mid RCA (Synergy XD 2.75 mm 16 mm deployed to 3.0 mm, postdilated to 3.1 mm proximally. TIMI-3 flow preserved, reduced to 0%. Minimal disease in the Left Coronary system. Normal LV size and function. EF estimated 55 to 65% with normal LVEDP. No RWMA RECOMMENDATIONS Anticipate same-day discharge with type a lesion in the mid RCA and successful PCI. ASA-Plavix DAPT for 6 months minimum.  Would continue Plavix monotherapy afterwards. Add metoprolol 25 mg twice daily Titrate statin to 40 mg daily. Follow-up with Dr. Servando Salina or APP. Bryan Lemma, MD  CT CORONARY [facility] W/CTA COR W/SCORE [facility] W/CM &/OR WO/CM  Addendum Date: 11/13/2022   ADDENDUM REPORT: 11/13/2022 11:22 EXAM: OVER-READ INTERPRETATION  CT CHEST The following report is an over-read performed by radiologist Dr. Curly Shores [facility] on 11/13/2022. This over-read does not include interpretation of cardiac or coronary anatomy or pathology. The coronary CTA interpretation by the cardiologist is attached. COMPARISON:  10/16/2022 FINDINGS: Cardiovascular: Findings discussed in the body of the report. No incidental pulmonary artery filling defects identified. Mediastinum/Nodes: No suspicious adenopathy identified. Imaged mediastinal structures  are unremarkable. Lungs/Pleura: Imaged lungs are clear. No pleural effusion or pneumothorax. Upper Abdomen: No acute abnormality. Musculoskeletal: No chest wall abnormality. No acute or significant osseous findings. IMPRESSION: No significant extracardiac incidental findings identified. Electronically Signed   By: Layla MawJoshua  Pleasure M.D.   On: 11/13/2022 11:22   Result Date: 11/13/2022 CLINICAL DATA:  57 year old with chest pain. EXAM: Cardiac/Coronary  CTA TECHNIQUE: The patient  was scanned on a [employer] scanner. FINDINGS: A 120 kV prospective scan was triggered in the descending thoracic aorta at 111 HU's. Axial non-contrast 3 mm slices were carried out through the heart. The data set was analyzed on a dedicated work station and scored using the Agatson method. Gantry rotation speed was 250 msecs and collimation was .6 mm. 0.8 mg of sl NTG was given. The 3D data set was reconstructed in 5% intervals of the 67-82 % of the R-R cycle. Diastolic phases were analyzed on a dedicated work station using MPR, MIP and VRT modes. The patient received 80 cc of contrast. Image quality: Fair, misregistration. Aorta:  Normal size.  No calcifications.  No dissection. Aortic Valve: No calcifications. Coronary Arteries:  Normal coronary origin.  Right dominance. RCA is a large dominant artery that gives rise to PDA (small caliber) and PLA. There is diffuse mixed plaque with mid 70-99% stenosis. (FFR 0.91 to 0.67-abnormal) Left main is a large artery that gives rise to LAD and LCX arteries. LAD is a large vessel that has diffuse mixed plaque, mild stenosis 30-49% (FFR 0.83 distally). D1- large sized vessel with mixed plaque LCX is a non-dominant artery.  There is diffuse mixed plaque. OM1-large, no significant stenosis. Other findings: Normal pulmonary vein drainage into the left atrium. Normal left atrial appendage without a thrombus. Normal size of the pulmonary artery. Please see radiology report for non cardiac findings. IMPRESSION: 1. Coronary calcium score of 1366. This was 7299 percentile for age and sex matched control. 2. Total plaque volume (TPV) 1030 mm3 which is 97 percentile for age-and sex matched controls (calcified plaque 234 mm3; non-calcified plaque 796 mm3). TPV is extensive. 3. Normal coronary origin with right dominance. 4. Severe mid RCA 70-99% stenosis. (FFR 0.91 to 0.67-abnormal). Mild stenosis in LAD. Diffuse mixed plaque. Electronically Signed: By: Donato SchultzMark  Skains M.D. On:  11/10/2022 17:36   CT CORONARY FRACTIONAL FLOW RESERVE FLUID ANALYSIS  Result Date: 11/10/2022 EXAM: FFRCT ANALYSIS FINDINGS: FFRct analysis was performed on the original cardiac CT angiogram dataset. Diagrammatic representation of the FFRct analysis is provided in a separate PDF document in PACS. This dictation was created using the PDF document and an interactive 3D model of the results. 3D model is not available in the EMR/PACS. Normal FFR range is >0.80. 1. Left Main: Normal 2. LAD: 0.93 distal 3. LCX: Normal 4. Ramus: N/a 5. RCA: 0.91 mid, 0.67 post lesion, abnormal. IMPRESSION: 1.  Abnormal FFR of mid RCA (0.91 to 0.67). Note: These examples are not recommendations of [employer] and only provided as examples of what other customers are doing. Electronically Signed   By: Donato SchultzMark  Skains M.D.   On: 11/10/2022 17:38    Disposition   Pt is being discharged home today in good condition.  Follow-up Plans & Appointments     Follow-up Information     Tobb, Lavona MoundKardie, DO Follow up on 11/22/2022.   Specialty: Cardiology Why: at 9:40am for your follow up appt Contact information: [address] [place] [address] [phone]  Discharge Instructions     Amb Referral to Cardiac Rehabilitation   Complete by: As directed    Diagnosis: Coronary Stents   After initial evaluation and assessments completed: Virtual Based Care may be provided alone or in conjunction with Phase 2 Cardiac Rehab based on patient barriers.: Yes   Intensive Cardiac Rehabilitation (ICR) [facility] location only OR Traditional Cardiac Rehabilitation (TCR) *If criteria for ICR are not met will enroll in TCR [facility] only): Yes        Discharge Medications   Allergies as of 11/16/2022       Reactions   Morphine And Related Shortness Of Breath, Itching   Patient can  tolerate if given Benadryl with medication,    Codeine Hives   Latex Rash        Medication List     STOP taking these  medications    naproxen sodium 220 MG tablet Commonly known as: ALEVE       TAKE these medications    acetaminophen 500 MG tablet Commonly known as: TYLENOL Take 1,000 mg by mouth every 6 (six) hours as needed for moderate pain.   amphetamine-dextroamphetamine 10 MG tablet Commonly known as: ADDERALL Take 10 mg by mouth daily.   aspirin EC 81 MG tablet Take 1 tablet (81 mg total) by mouth daily. Swallow whole.   Biotin 5000 5 MG Caps Generic drug: Biotin Take 5 mg by mouth daily.   clopidogrel 75 MG tablet Commonly known as: Plavix Take 1 tablet (75 mg total) by mouth daily.   diazepam 2 MG tablet Commonly known as: VALIUM Take 2 mg by mouth 2 (two) times daily as needed for anxiety.   diphenhydrAMINE 25 MG tablet Commonly known as: BENADRYL Take 25 mg by mouth every 6 (six) hours as needed for allergies.   estradiol 1 MG tablet Commonly known as: ESTRACE Take 1 tablet (1 mg total) by mouth daily.   Lumify 0.025 % Soln Generic drug: Brimonidine Tartrate Place 1 drop into both eyes daily as needed (redness/irritation).   metoprolol tartrate 25 MG tablet Commonly known as: LOPRESSOR Take 1 tablet (25 mg total) by mouth 2 (two) times daily.   multivitamin with minerals tablet Take 2 tablets by mouth daily.   HAIR SKIN & NAILS PO Take 2 tablets by mouth daily.   nitroGLYCERIN 0.4 MG SL tablet Commonly known as: NITROSTAT Place 1 tablet (0.4 mg total) under the tongue every 5 (five) minutes as needed for chest pain.   NYQUIL PO Take 1 Dose by mouth daily as needed (sleep).   oxybutynin 5 MG 24 hr tablet Commonly known as: DITROPAN-XL Take 5 mg by mouth at bedtime.   Ozempic (0.25 or 0.5 MG/DOSE) 2 MG/3ML Sopn Generic drug: Semaglutide(0.25 or 0.5MG /DOS) Inject 0.5 mg into the skin every Tuesday.   rOPINIRole 1 MG tablet Commonly known as: REQUIP Take 1 mg by mouth at bedtime.   rosuvastatin 40 MG tablet Commonly known as: CRESTOR Take 1 tablet  (40 mg total) by mouth daily. What changed:  medication strength how much to take   sertraline 50 MG tablet Commonly known as: ZOLOFT Take 50 mg by mouth daily.        Allergies Allergies  Allergen Reactions   Morphine And Related Shortness Of Breath and Itching    Patient can  tolerate if given Benadryl with medication,    Codeine Hives   Latex Rash    Outstanding Labs/Studies   FLP/LFTs in 8 weeks   Duration of  Discharge Encounter   Greater than 30 minutes including physician time.  Signed, Laverda Page, NP 11/16/2022, 2:10 PM

## 2022-11-18 ENCOUNTER — Telehealth: Payer: Self-pay | Admitting: Cardiology

## 2022-11-18 NOTE — Telephone Encounter (Signed)
Pt would like to speak to RN about her cath and some medications she is on.

## 2022-11-18 NOTE — Telephone Encounter (Signed)
Called pt, set up appt for tomorrow with Dr. Servando Salina. Reiterated the ED precautions to pt. She verbalized understanding.

## 2022-11-18 NOTE — Telephone Encounter (Signed)
Returned call to patient to follow up- patient states that she still almost the same. Patient states that she feels like she sore almost. Patient states she is unsure if it could be related about how she was laying on the cath table during procedure. Patient states its just strange and she is anxious about it. Made patient aware of ED precautions should new or worsening symptoms develop. Patient verbalized understanding.   Advised patient I would forward to Dr. Servando Salina.

## 2022-11-18 NOTE — Telephone Encounter (Signed)
Returned call to patient who was asking questions about the need for plavix post cath- educated patient on this. Patient states that also starting last night she started having some tightness in her chest and upperback area leading up to her neck. Patient states that its not the same as before the cath but its in the same place and states it is difficult to describe. Patient reports its more tightness than it is pain. No shortness of breath. Patient states that nothing makes it better or worse and reports she is nervous to take nitro because she is unsure of what this could be. Patient unsure of BP/HR. Patient denies any lightheadedness or dizziness. Patient reports she is compliant with medications and has taken these today including the Plavix/asa. Made patient aware of ED precautions should new or worsening symptoms develop. Patient verbalized understanding.    Will forward to MD for review and advice.

## 2022-11-19 ENCOUNTER — Encounter: Payer: Self-pay | Admitting: Cardiology

## 2022-11-19 ENCOUNTER — Ambulatory Visit: Payer: 59 | Attending: Cardiology | Admitting: Cardiology

## 2022-11-19 VITALS — BP 120/72 | HR 53 | Ht 65.0 in | Wt 129.0 lb

## 2022-11-19 DIAGNOSIS — R7303 Prediabetes: Secondary | ICD-10-CM

## 2022-11-19 DIAGNOSIS — Z9861 Coronary angioplasty status: Secondary | ICD-10-CM | POA: Diagnosis not present

## 2022-11-19 DIAGNOSIS — E782 Mixed hyperlipidemia: Secondary | ICD-10-CM

## 2022-11-19 DIAGNOSIS — Z79899 Other long term (current) drug therapy: Secondary | ICD-10-CM

## 2022-11-19 DIAGNOSIS — I2510 Atherosclerotic heart disease of native coronary artery without angina pectoris: Secondary | ICD-10-CM

## 2022-11-19 DIAGNOSIS — F419 Anxiety disorder, unspecified: Secondary | ICD-10-CM | POA: Diagnosis not present

## 2022-11-19 MED ORDER — ISOSORBIDE MONONITRATE ER 30 MG PO TB24: 30.0000 mg | ORAL_TABLET | Freq: Every day | ORAL | 3 refills | Status: DC

## 2022-11-19 NOTE — Progress Notes (Unsigned)
Cardiology Office Note:    Date:  11/20/2022   ID:  Martha [name], DOB August 08, 1966, MRN [id number]  PCP:  Martha Bushy, FNP  Cardiologist:  Martha Ripple, DO  Electrophysiologist:  None   Referring MD: Martha Bushy, FNP   " I am having chest pain"   History of Present Illness:    Martha [name] is a 57 y.o. female with a hx of coronary artery disease seen on coronary CTA, Hypertension and prediabetes, hyperlipidemia and GERD.   At her last visit I referred the patient to get a coronary CTA due to her chest pain which came back showing significant CAD.  She is status post DES to the RCA. She called reporting that she was experiencing chest discomfort.  She notes that she is experiencing same chest discomfort that she had prior to her stent placement.  No other complaints at this time.   Past Medical History:  Diagnosis Date   Abdominal pain in female 11/27/2015   ADHD    Anxiety    Arthritis    Body aches 11/27/2015   Current use of estrogen therapy 11/27/2015   Depression    GERD (gastroesophageal reflux disease)    Heart murmur    Hematuria 11/27/2015   History of depression    History of kidney stones    "must still be there."   Hot flashes due to surgical menopause 11/27/2015   Hyperlipemia 11/27/2015   Hyperlipidemia    Mental disorder    history of depression   Moody 11/27/2015   Nausea 11/27/2015   Neck pain    PTSD (post-traumatic stress disorder)    PTSD (post-traumatic stress disorder)    Vaginal itching 12/11/2015    Past Surgical History:  Procedure Laterality Date   ABDOMINAL HYSTERECTOMY     APPENDECTOMY     BACK SURGERY     CARPAL TUNNEL RELEASE Right    COLON SURGERY     COLONOSCOPY     CORONARY STENT INTERVENTION N/A 11/16/2022   Procedure: CORONARY STENT INTERVENTION;  Surgeon: Marykay Lex, MD;  Location: [facility] INVASIVE CV LAB;  Service: Cardiovascular;  Laterality: N/A;   LEFT HEART CATH AND CORONARY ANGIOGRAPHY N/A 11/16/2022    Procedure: LEFT HEART CATH AND CORONARY ANGIOGRAPHY;  Surgeon: Marykay Lex, MD;  Location: [facility] INVASIVE CV LAB;  Service: Cardiovascular;  Laterality: N/A;   LUMBAR FUSION     L5-S1   NECK SURGERY     x 2   TOOTH EXTRACTION N/A 11/14/2020   Procedure: DENTAL RESTORATION/EXTRACTIONS;  Surgeon: Ocie Doyne, DMD;  Location: [facility] OR;  Service: Oral Surgery;  Laterality: N/A;   UPPER GASTROINTESTINAL ENDOSCOPY     WRIST SURGERY Left    cyst removal    Current Medications: Current Meds  Medication Sig   acetaminophen (TYLENOL) 500 MG tablet Take 1,000 mg by mouth every 6 (six) hours as needed for moderate pain.   amphetamine-dextroamphetamine (ADDERALL) 10 MG tablet Take 10 mg by mouth daily.   aspirin EC 81 MG tablet Take 1 tablet (81 mg total) by mouth daily. Swallow whole.   Biotin (BIOTIN 5000) 5 MG CAPS Take 5 mg by mouth daily.   Brimonidine Tartrate (LUMIFY) 0.025 % SOLN Place 1 drop into both eyes daily as needed (redness/irritation).   clopidogrel (PLAVIX) 75 MG tablet Take 1 tablet (75 mg total) by mouth daily.   diazepam (VALIUM) 2 MG tablet Take 2 mg by mouth 2 (two) times daily as needed for  anxiety.   diphenhydrAMINE (BENADRYL) 25 MG tablet Take 25 mg by mouth every 6 (six) hours as needed for allergies.   estradiol (ESTRACE) 1 MG tablet Take 1 tablet (1 mg total) by mouth daily.   isosorbide mononitrate (IMDUR) 30 MG 24 hr tablet Take 1 tablet (30 mg total) by mouth daily.   metoprolol tartrate (LOPRESSOR) 25 MG tablet Take 1 tablet (25 mg total) by mouth 2 (two) times daily.   Multiple Vitamins-Minerals (HAIR SKIN & NAILS PO) Take 2 tablets by mouth daily.   Multiple Vitamins-Minerals (MULTIVITAMIN WITH MINERALS) tablet Take 2 tablets by mouth daily.   nitroGLYCERIN (NITROSTAT) 0.4 MG SL tablet Place 1 tablet (0.4 mg total) under the tongue every 5 (five) minutes as needed for chest pain.   oxybutynin (DITROPAN-XL) 5 MG 24 hr tablet Take 5 mg by mouth at bedtime.    OZEMPIC, 0.25 OR 0.5 MG/DOSE, 2 MG/3ML SOPN Inject 0.5 mg into the skin every Tuesday.   Pseudoeph-Doxylamine-DM-APAP (NYQUIL PO) Take 1 Dose by mouth daily as needed (sleep).   rOPINIRole (REQUIP) 1 MG tablet Take 1 mg by mouth at bedtime.   rosuvastatin (CRESTOR) 40 MG tablet Take 1 tablet (40 mg total) by mouth daily.   sertraline (ZOLOFT) 50 MG tablet Take 50 mg by mouth daily.     Allergies:   Morphine and related, Codeine, and Latex   Social History   Socioeconomic History   Marital status: Legally Separated    Spouse name: Not on file   Number of children: 2   Years of education: Not on file   Highest education level: Not on file  Occupational History   Occupation: disabled  Tobacco Use   Smoking status: Former    Packs/day: 1.00    Years: 25.00    Additional pack years: 0.00    Total pack years: 25.00    Types: Cigarettes    Quit date: 03/11/2013    Years since quitting: 9.7   Smokeless tobacco: Never   Tobacco comments:    vapes  Vaping Use   Vaping Use: Former   Quit date: 11/26/2020  Substance and Sexual Activity   Alcohol use: No   Drug use: No   Sexual activity: Not Currently    Birth control/protection: Surgical    Comment: hyst  Other Topics Concern   Not on file  Social History Narrative   Not on file   Social Determinants of Health   Financial Resource Strain: Not on file  Food Insecurity: Not on file  Transportation Needs: Not on file  Physical Activity: Not on file  Stress: Not on file  Social Connections: Not on file     Family History: The patient's family history includes Bone cancer in her mother; COPD in her sister; Cancer in an other family member; Emphysema in her father. There is no history of Colon cancer, Pancreatic cancer, Esophageal cancer, or Colon polyps.  ROS:   Review of Systems  Constitution: Negative for decreased appetite, fever and weight gain.  HENT: Negative for congestion, ear discharge, hoarse voice and sore throat.    Eyes: Negative for discharge, redness, vision loss in right eye and visual halos.  Cardiovascular: Negative for chest pain, dyspnea on exertion, leg swelling, orthopnea and palpitations.  Respiratory: Negative for cough, hemoptysis, shortness of breath and snoring.   Endocrine: Negative for heat intolerance and polyphagia.  Hematologic/Lymphatic: Negative for bleeding problem. Does not bruise/bleed easily.  Skin: Negative for flushing, nail changes, rash and suspicious lesions.  Musculoskeletal: Negative for arthritis, joint pain, muscle cramps, myalgias, neck pain and stiffness.  Gastrointestinal: Negative for abdominal pain, bowel incontinence, diarrhea and excessive appetite.  Genitourinary: Negative for decreased libido, genital sores and incomplete emptying.  Neurological: Negative for brief paralysis, focal weakness, headaches and loss of balance.  Psychiatric/Behavioral: Negative for altered mental status, depression and suicidal ideas.  Allergic/Immunologic: Negative for HIV exposure and persistent infections.    EKGs/Labs/Other Studies Reviewed:    The following studies were reviewed today:   EKG:  The ekg ordered today demonstrates   Recent Labs: 10/16/2022: ALT 16; B Natriuretic Peptide 43.0; TSH 0.757 11/19/2022: BUN 16; Creatinine, Ser 0.66; Hemoglobin 14.5; Magnesium 2.0; Platelets 188; Potassium 4.3; Sodium 142  Recent Lipid Panel    Component Value Date/Time   CHOL 196 12/11/2020 0000   CHOL 250 (H) 11/27/2015 1132   TRIG 179 (A) 12/11/2020 0000   HDL 79 (A) 12/11/2020 0000   HDL 78 11/27/2015 1132   CHOLHDL 3.2 11/27/2015 1132   CHOLHDL 4.9 07/07/2010 0609   VLDL 30 07/07/2010 0609   LDLCALC 87 12/11/2020 0000   LDLCALC 156 (H) 11/27/2015 1132    Physical Exam:    VS:  BP 120/72   Pulse (!) 53   Ht 5\' 5"  (1.651 m)   Wt 129 lb (58.5 kg)   SpO2 99%   BMI 21.47 kg/m     Wt Readings from Last 3 Encounters:  11/19/22 129 lb (58.5 kg)  11/16/22 126 lb  (57.2 kg)  11/12/22 125 lb 12.8 oz (57.1 kg)     GEN: Well nourished, well developed in no acute distress HEENT: Normal NECK: No JVD; No carotid bruits LYMPHATICS: No lymphadenopathy CARDIAC: S1S2 noted,RRR, no murmurs, rubs, gallops RESPIRATORY:  Clear to auscultation without rales, wheezing or rhonchi  ABDOMEN: Soft, non-tender, non-distended, +bowel sounds, no guarding. EXTREMITIES: No edema, No cyanosis, no clubbing MUSCULOSKELETAL:  No deformity  SKIN: Warm and dry NEUROLOGIC:  Alert and oriented x 3, non-focal PSYCHIATRIC:  Normal affect, good insight  ASSESSMENT:    1. History of percutaneous coronary intervention   2. Medication management   3. Anxiety    PLAN:     1.  She is here with her daughter today reporting left-sided chest discomfort.  She is status post DES to the LAD.  Will continue her dual antiplatelet with aspirin and clopidogrel.  I am going to add low-dose Imdur 30 mg daily to see if this can help. Will refer the patient to our cardiac rehab.  The patient is in agreement with the above plan. The patient left the office in stable condition.  The patient will follow up in   Medication Adjustments/Labs and Tests Ordered: Current medicines are reviewed at length with the patient today.  Concerns regarding medicines are outlined above.  Orders Placed This Encounter  Procedures   Basic Metabolic Panel (BMET)   Magnesium   CBC with Differential/Platelet   AMB referral to cardiac rehabilitation   Ambulatory referral to Psychology   Meds ordered this encounter  Medications   isosorbide mononitrate (IMDUR) 30 MG 24 hr tablet    Sig: Take 1 tablet (30 mg total) by mouth daily.    Dispense:  90 tablet    Refill:  3    Patient Instructions  Medication Instructions:  Your physician has recommended you make the following change in your medication:  START: Imdur 30 mg once daily *If you need a refill on your cardiac medications before your next  appointment, please call your pharmacy*   Lab Work: Your physician recommends that you have labs drawn today: BMET, Mag, CBC  If you have labs (blood work) drawn today and your tests are completely normal, you will receive your results only by: MyChart Message (if you have MyChart) OR A paper copy in the mail If you have any lab test that is abnormal or we need to change your treatment, we will call you to review the results.   Testing/Procedures: None   Follow-Up: At [facility], you and your health needs are our priority.  As part of our continuing mission to provide you with exceptional heart care, we have created designated Provider Care Teams.  These Care Teams include your primary Cardiologist (physician) and Advanced Practice Providers (APPs -  Physician Assistants and Nurse Practitioners) who all work together to provide you with the care you need, when you need it.  Your next appointment:   12-16 week(s)  Provider:   Thomasene Ripple, DO     Other Instructions  Hilbert Corrigan, PsyD Address: [address], [place], [place] [address] Phone: [phone]   Adopting a Healthy Lifestyle.  Know what a healthy weight is for you (roughly BMI <25) and aim to maintain this   Aim for 7+ servings of fruits and vegetables daily   65-80+ fluid ounces of water or unsweet tea for healthy kidneys   Limit to max 1 drink of alcohol per day; avoid smoking/tobacco   Limit animal fats in diet for cholesterol and heart health - choose grass fed whenever available   Avoid highly processed foods, and foods high in saturated/trans fats   Aim for low stress - take time to unwind and care for your mental health   Aim for 150 min of moderate intensity exercise weekly for heart health, and weights twice weekly for bone health   Aim for 7-9 hours of sleep daily   When it comes to diets, agreement about the perfect plan isnt easy to find, even among the  experts. Experts at the [facility] of [employer] developed an idea known as the Healthy Eating Plate. Just imagine a plate divided into logical, healthy portions.   The emphasis is on diet quality:   Load up on vegetables and fruits - one-half of your plate: Aim for color and variety, and remember that potatoes dont count.   Go for whole grains - one-quarter of your plate: Whole wheat, barley, wheat berries, quinoa, oats, brown rice, and foods made with them. If you want pasta, go with whole wheat pasta.   Protein power - one-quarter of your plate: Fish, chicken, beans, and nuts are all healthy, versatile protein sources. Limit red meat.   The diet, however, does go beyond the plate, offering a few other suggestions.   Use healthy plant oils, such as olive, canola, soy, corn, sunflower and peanut. Check the labels, and avoid partially hydrogenated oil, which have unhealthy trans fats.   If youre thirsty, drink water. Coffee and tea are good in moderation, but skip sugary drinks and limit milk and dairy products to one or two daily servings.   The type of carbohydrate in the diet is more important than the amount. Some sources of carbohydrates, such as vegetables, fruits, whole grains, and beans-are healthier than others.   Finally, stay active  Signed, Martha Ripple, DO  11/20/2022 5:13 PM    [facility]

## 2022-11-19 NOTE — Patient Instructions (Addendum)
Medication Instructions:  Your physician has recommended you make the following change in your medication:  START: Imdur 30 mg once daily *If you need a refill on your cardiac medications before your next appointment, please call your pharmacy*   Lab Work: Your physician recommends that you have labs drawn today: BMET, Mag, CBC  If you have labs (blood work) drawn today and your tests are completely normal, you will receive your results only by: MyChart Message (if you have MyChart) OR A paper copy in the mail If you have any lab test that is abnormal or we need to change your treatment, we will call you to review the results.   Testing/Procedures: None   Follow-Up: At [facility], you and your health needs are our priority.  As part of our continuing mission to provide you with exceptional heart care, we have created designated Provider Care Teams.  These Care Teams include your primary Cardiologist (physician) and Advanced Practice Providers (APPs -  Physician Assistants and Nurse Practitioners) who all work together to provide you with the care you need, when you need it.  Your next appointment:   12-16 week(s)  Provider:   Thomasene Ripple, DO     Other Instructions  Hilbert Corrigan, PsyD Address: [address], [place], [place] [address] Phone: [phone]

## 2022-11-20 LAB — CBC WITH DIFFERENTIAL/PLATELET
Basophils Absolute: 0.1 10*3/uL (ref 0.0–0.2)
Basos: 1 %
EOS (ABSOLUTE): 0.4 10*3/uL (ref 0.0–0.4)
Eos: 4 %
Hematocrit: 42.5 % (ref 34.0–46.6)
Hemoglobin: 14.5 g/dL (ref 11.1–15.9)
Immature Grans (Abs): 0 10*3/uL (ref 0.0–0.1)
Immature Granulocytes: 0 %
Lymphocytes Absolute: 1.8 10*3/uL (ref 0.7–3.1)
Lymphs: 22 %
MCH: 31.1 pg (ref 26.6–33.0)
MCHC: 34.1 g/dL (ref 31.5–35.7)
MCV: 91 fL (ref 79–97)
Monocytes Absolute: 0.6 10*3/uL (ref 0.1–0.9)
Monocytes: 7 %
Neutrophils Absolute: 5.6 10*3/uL (ref 1.4–7.0)
Neutrophils: 66 %
Platelets: 188 10*3/uL (ref 150–450)
RBC: 4.66 x10E6/uL (ref 3.77–5.28)
RDW: 12 % (ref 11.7–15.4)
WBC: 8.5 10*3/uL (ref 3.4–10.8)

## 2022-11-20 LAB — BASIC METABOLIC PANEL
BUN/Creatinine Ratio: 24 — ABNORMAL HIGH (ref 9–23)
BUN: 16 mg/dL (ref 6–24)
CO2: 27 mmol/L (ref 20–29)
Calcium: 9.4 mg/dL (ref 8.7–10.2)
Chloride: 103 mmol/L (ref 96–106)
Creatinine, Ser: 0.66 mg/dL (ref 0.57–1.00)
Glucose: 102 mg/dL — ABNORMAL HIGH (ref 70–99)
Potassium: 4.3 mmol/L (ref 3.5–5.2)
Sodium: 142 mmol/L (ref 134–144)
eGFR: 103 mL/min/{1.73_m2} (ref >59)

## 2022-11-20 LAB — MAGNESIUM: Magnesium: 2 mg/dL (ref 1.6–2.3)

## 2022-11-22 ENCOUNTER — Ambulatory Visit: Payer: 59 | Admitting: Cardiology

## 2022-11-23 ENCOUNTER — Ambulatory Visit: Payer: 59 | Admitting: Internal Medicine

## 2022-11-30 ENCOUNTER — Telehealth: Payer: Self-pay

## 2022-11-30 MED ORDER — RANOLAZINE ER 500 MG PO TB12: 500.0000 mg | ORAL_TABLET | Freq: Two times a day (BID) | ORAL | 3 refills | Status: DC

## 2022-11-30 MED ORDER — NITROGLYCERIN 0.4 MG SL SUBL: 0.4000 mg | SUBLINGUAL_TABLET | SUBLINGUAL | 2 refills | Status: DC | PRN

## 2022-11-30 NOTE — Telephone Encounter (Signed)
Called pt to relay Dr. Mallory Shirk message. Pt verbalized understanding. Pt will see her PCP tomorrow morning. Pt advised to mention the pain to the provider. No further questions at this time.

## 2022-11-30 NOTE — Telephone Encounter (Signed)
Message made into an encounter. See chart.

## 2022-11-30 NOTE — Telephone Encounter (Signed)
Message from MyChart: Good morning Martha [name], could you please give me a call when you get a chance! That new medication that dr Servando Salina put me on I'm keeping headaches, I have had a headache last night and still have it !   Called pt she states she has been having headaches but they got worst when she started taking Imdur. Pt states "yesterday I had the sharpest pain, I don't know what is going on. The pain is still right there where it has been." Will relay message to Dr. Servando Salina for review.

## 2022-12-02 ENCOUNTER — Ambulatory Visit (HOSPITAL_COMMUNITY): Payer: 59 | Attending: Internal Medicine

## 2022-12-02 DIAGNOSIS — R0609 Other forms of dyspnea: Secondary | ICD-10-CM | POA: Diagnosis not present

## 2022-12-02 DIAGNOSIS — R072 Precordial pain: Secondary | ICD-10-CM | POA: Diagnosis not present

## 2022-12-03 LAB — ECHOCARDIOGRAM COMPLETE
Area-P 1/2: 3.07 cm2
S' Lateral: 2.9 cm

## 2022-12-07 ENCOUNTER — Telehealth: Payer: Self-pay | Admitting: Cardiology

## 2022-12-07 NOTE — Telephone Encounter (Signed)
Pt c/o medication issue:  1. Name of Medication: Adderall   2. How are you currently taking this medication (dosage and times per day)?   3. Are you having a reaction (difficulty breathing--STAT)?   4. What is your medication issue? Patient's PCP is requesting pt call to get the okay and a letter of approval for patient to continue this medication. Requesting call back.

## 2022-12-07 NOTE — Telephone Encounter (Signed)
Patient states she needs her PCP wanted to know if its ok to continue taking adderall from a cardiac standpoint. If so she needs a letter stating that she can continue taking adderall from her cardiologist.

## 2022-12-08 NOTE — Telephone Encounter (Signed)
Called pt tp let her know Dr. Servando Salina is okay with her starting out on a low dose and monitoring. Pt verbalized understanding.  Called pt's PCP was given another number [phone]. No answer, left message.

## 2022-12-15 ENCOUNTER — Telehealth: Payer: Self-pay | Admitting: Cardiology

## 2022-12-15 NOTE — Telephone Encounter (Signed)
Spoke with patient of Dr. Servando Salina -- she has uncontrollable diarrhea daily since starting these medications on 11/16/22 (heart cath). She also reports a lot of gas. She has not tried any OTC meds for relief.   Advised will send a message to PharmD team for med review and also Dr. Servando Salina

## 2022-12-15 NOTE — Telephone Encounter (Addendum)
Spoke with patient. Advised of info per [facility]. Will await Dr. Mallory Shirk review

## 2022-12-15 NOTE — Telephone Encounter (Signed)
Pt c/o medication issue:  1. Name of Medication:   clopidogrel (PLAVIX) 75 MG tablet  rosuvastatin (CRESTOR) 40 MG tablet  metoprolol tartrate (LOPRESSOR) 25 MG tablet  ranolazine (RANEXA) 500 MG 12 hr tablet    2. How are you currently taking this medication (dosage and times per day)? As prescribed  3. Are you having a reaction (difficulty breathing--STAT)? Yes  4. What is your medication issue? Patient states that she has been having issues with her bowel since taking these medications and would like a call back to discuss.

## 2022-12-15 NOTE — Telephone Encounter (Signed)
Metoprolol has a 2-5% reported incidence of diarrhea. There is a low incidence of this with most beta blockers. Not a reported side effect of her other newly started meds. Started on metoprolol with recent stent placement, would defer to MD for any potential med changes. Ok to use Tums or Imodium OTC for symptom control in the interim.

## 2022-12-16 ENCOUNTER — Telehealth: Payer: Self-pay | Admitting: Cardiology

## 2022-12-16 NOTE — Telephone Encounter (Signed)
°*  STAT* If patient is at the pharmacy, call can be transferred to refill team. ° ° °1. Which medications need to be refilled? (please list name of each medication and dose if known) metoprolol tartrate (LOPRESSOR) 25 MG tablet ° °2. Which pharmacy/location (including street and city if local pharmacy) is medication to be sent to? [facility] - [place], [place] - [address] ° °3. Do they need a 30 day or 90 day supply? 90 ° °

## 2022-12-17 MED ORDER — METOPROLOL TARTRATE 25 MG PO TABS: 25.0000 mg | ORAL_TABLET | Freq: Two times a day (BID) | ORAL | 1 refills | Status: DC

## 2022-12-17 NOTE — Telephone Encounter (Signed)
Refills has been sent to the pharmacy. 

## 2022-12-30 ENCOUNTER — Encounter (HOSPITAL_COMMUNITY): Payer: 59

## 2023-02-14 ENCOUNTER — Ambulatory Visit: Payer: 59 | Admitting: Cardiology

## 2023-02-15 ENCOUNTER — Telehealth: Payer: Self-pay | Admitting: Cardiology

## 2023-02-15 NOTE — Telephone Encounter (Signed)
*  STAT* If patient is at the pharmacy, call can be transferred to refill team.   1. Which medications need to be refilled? (please list name of each medication and dose if known)  aspirin EC 81 MG tablet clopidogrel (PLAVIX) 75 MG tablet rosuvastatin (CRESTOR) 40 MG tablet  2. Which pharmacy/location (including street and city if local pharmacy) is medication to be sent to? [facility] - , [place] - [address]  3. Do they need a 30 day or 90 day supply?  90 day supply  Patient states she is completely out of medication.

## 2023-02-16 MED ORDER — CLOPIDOGREL BISULFATE 75 MG PO TABS: 75.0000 mg | ORAL_TABLET | Freq: Every day | ORAL | 3 refills | Status: DC

## 2023-02-16 MED ORDER — ROSUVASTATIN CALCIUM 40 MG PO TABS: 40.0000 mg | ORAL_TABLET | Freq: Every day | ORAL | 3 refills | Status: DC

## 2023-02-16 MED ORDER — ASPIRIN 81 MG PO TBEC: 81.0000 mg | DELAYED_RELEASE_TABLET | Freq: Every day | ORAL | 3 refills | Status: DC

## 2023-02-16 NOTE — Telephone Encounter (Signed)
Pt's medications were sent to pt's pharmacy as requested. Confirmation received.  

## 2023-02-22 ENCOUNTER — Other Ambulatory Visit: Payer: Self-pay | Admitting: Cardiology

## 2023-02-23 ENCOUNTER — Ambulatory Visit: Payer: 59 | Admitting: Cardiology

## 2023-03-22 ENCOUNTER — Other Ambulatory Visit: Payer: Self-pay | Admitting: Cardiology

## 2023-05-09 ENCOUNTER — Ambulatory Visit: Payer: 59 | Attending: Cardiology | Admitting: Cardiology

## 2023-05-09 ENCOUNTER — Encounter: Payer: Self-pay | Admitting: Cardiology

## 2023-05-09 VITALS — BP 118/70 | HR 59 | Ht 65.0 in | Wt 139.4 lb

## 2023-05-09 DIAGNOSIS — I2510 Atherosclerotic heart disease of native coronary artery without angina pectoris: Secondary | ICD-10-CM

## 2023-05-09 DIAGNOSIS — F339 Major depressive disorder, recurrent, unspecified: Secondary | ICD-10-CM | POA: Diagnosis not present

## 2023-05-09 DIAGNOSIS — E782 Mixed hyperlipidemia: Secondary | ICD-10-CM | POA: Diagnosis not present

## 2023-05-09 NOTE — Progress Notes (Unsigned)
Cardiology Office Note:    Date:  05/09/2023   ID:  Martha [name], DOB [birth year], MRN [id number]  PCP:  Jerrye Bushy, FNP  Cardiologist:  Thomasene Ripple, DO  Electrophysiologist:  None   Referring MD: Jerrye Bushy, FNP   " I am just feeling tired"  History of Present Illness:    Martha [name] is a 57 y.o. female with a hx of   Past Medical History:  Diagnosis Date   Abdominal pain in female 11/27/2015   ADHD    Anxiety    Arthritis    Body aches 11/27/2015   Current use of estrogen therapy 11/27/2015   Depression    GERD (gastroesophageal reflux disease)    Heart murmur    Hematuria 11/27/2015   History of depression    History of kidney stones    "must still be there."   Hot flashes due to surgical menopause 11/27/2015   Hyperlipemia 11/27/2015   Hyperlipidemia    Mental disorder    history of depression   Moody 11/27/2015   Nausea 11/27/2015   Neck pain    PTSD (post-traumatic stress disorder)    PTSD (post-traumatic stress disorder)    Vaginal itching 12/11/2015    Past Surgical History:  Procedure Laterality Date   ABDOMINAL HYSTERECTOMY     APPENDECTOMY     BACK SURGERY     CARPAL TUNNEL RELEASE Right    COLON SURGERY     COLONOSCOPY     CORONARY STENT INTERVENTION N/A 11/16/2022   Procedure: CORONARY STENT INTERVENTION;  Surgeon: Marykay Lex, MD;  Location: [facility] INVASIVE CV LAB;  Service: Cardiovascular;  Laterality: N/A;   LEFT HEART CATH AND CORONARY ANGIOGRAPHY N/A 11/16/2022   Procedure: LEFT HEART CATH AND CORONARY ANGIOGRAPHY;  Surgeon: Marykay Lex, MD;  Location: [facility] INVASIVE CV LAB;  Service: Cardiovascular;  Laterality: N/A;   LUMBAR FUSION     L5-S1   NECK SURGERY     x 2   TOOTH EXTRACTION N/A 11/14/2020   Procedure: DENTAL RESTORATION/EXTRACTIONS;  Surgeon: Ocie Doyne, DMD;  Location: [facility] OR;  Service: Oral Surgery;  Laterality: N/A;   UPPER GASTROINTESTINAL ENDOSCOPY     WRIST SURGERY Left    cyst removal    Current  Medications: Current Meds  Medication Sig   acetaminophen (TYLENOL) 500 MG tablet Take 1,000 mg by mouth every 6 (six) hours as needed for moderate pain.   aspirin EC 81 MG tablet Take 1 tablet (81 mg total) by mouth daily. Swallow whole.   Brimonidine Tartrate (LUMIFY) 0.025 % SOLN Place 1 drop into both eyes daily as needed (redness/irritation).   clopidogrel (PLAVIX) 75 MG tablet Take 1 tablet (75 mg total) by mouth daily.   diazepam (VALIUM) 2 MG tablet Take 2 mg by mouth 2 (two) times daily as needed for anxiety.   estradiol (ESTRACE) 1 MG tablet Take 1 tablet (1 mg total) by mouth daily.   nitroGLYCERIN (NITROSTAT) 0.4 MG SL tablet DISSOLVE 1 TABLET SUBLINGUALLY AS NEEDED FOR CHEST PAIN, MAY REPEAT EVERY 5 MINUTES. AFTER 3 CALL 911.   oxybutynin (DITROPAN-XL) 5 MG 24 hr tablet Take 5 mg by mouth at bedtime.   OZEMPIC, 0.25 OR 0.5 MG/DOSE, 2 MG/3ML SOPN Inject 0.5 mg into the skin every Tuesday.   Pseudoeph-Doxylamine-DM-APAP (NYQUIL PO) Take 1 Dose by mouth daily as needed (sleep).   rOPINIRole (REQUIP) 2 MG tablet Take 2 mg by mouth at bedtime.   rosuvastatin (CRESTOR) 40  MG tablet Take 1 tablet (40 mg total) by mouth daily.   sertraline (ZOLOFT) 50 MG tablet Take 50 mg by mouth daily.     Allergies:   Morphine and codeine, Codeine, and Latex   Social History   Socioeconomic History   Marital status: Legally Separated    Spouse name: Not on file   Number of children: 2   Years of education: Not on file   Highest education level: Not on file  Occupational History   Occupation: disabled  Tobacco Use   Smoking status: Former    Current packs/day: 0.00    Average packs/day: 1 pack/day for 25.0 years (25.0 ttl pk-yrs)    Types: Cigarettes    Start date: 03/11/1988    Quit date: 03/11/2013    Years since quitting: 10.1   Smokeless tobacco: Never   Tobacco comments:    vapes  Vaping Use   Vaping status: Former   Quit date: 11/26/2020  Substance and Sexual Activity   Alcohol  use: No   Drug use: No   Sexual activity: Not Currently    Birth control/protection: Surgical    Comment: hyst  Other Topics Concern   Not on file  Social History Narrative   Not on file   Social Determinants of Health   Financial Resource Strain: Not on file  Food Insecurity: Not on file  Transportation Needs: Not on file  Physical Activity: Not on file  Stress: Not on file  Social Connections: Not on file     Family History: The patient's family history includes Bone cancer in her mother; COPD in her sister; Cancer in an other family member; Emphysema in her father. There is no history of Colon cancer, Pancreatic cancer, Esophageal cancer, or Colon polyps.  ROS:   Review of Systems  Constitution: Negative for decreased appetite, fever and weight gain.  HENT: Negative for congestion, ear discharge, hoarse voice and sore throat.   Eyes: Negative for discharge, redness, vision loss in right eye and visual halos.  Cardiovascular: Negative for chest pain, dyspnea on exertion, leg swelling, orthopnea and palpitations.  Respiratory: Negative for cough, hemoptysis, shortness of breath and snoring.   Endocrine: Negative for heat intolerance and polyphagia.  Hematologic/Lymphatic: Negative for bleeding problem. Does not bruise/bleed easily.  Skin: Negative for flushing, nail changes, rash and suspicious lesions.  Musculoskeletal: Negative for arthritis, joint pain, muscle cramps, myalgias, neck pain and stiffness.  Gastrointestinal: Negative for abdominal pain, bowel incontinence, diarrhea and excessive appetite.  Genitourinary: Negative for decreased libido, genital sores and incomplete emptying.  Neurological: Negative for brief paralysis, focal weakness, headaches and loss of balance.  Psychiatric/Behavioral: Negative for altered mental status, depression and suicidal ideas.  Allergic/Immunologic: Negative for HIV exposure and persistent infections.    EKGs/Labs/Other Studies  Reviewed:    The following studies were reviewed today:   EKG:  The ekg ordered today demonstrates   Recent Labs: 10/16/2022: ALT 16; B Natriuretic Peptide 43.0; TSH 0.757 11/19/2022: BUN 16; Creatinine, Ser 0.66; Hemoglobin 14.5; Magnesium 2.0; Platelets 188; Potassium 4.3; Sodium 142  Recent Lipid Panel    Component Value Date/Time   CHOL 196 12/11/2020 0000   CHOL 250 (H) 11/27/2015 1132   TRIG 179 (A) 12/11/2020 0000   HDL 79 (A) 12/11/2020 0000   HDL 78 11/27/2015 1132   CHOLHDL 3.2 11/27/2015 1132   CHOLHDL 4.9 07/07/2010 0609   VLDL 30 07/07/2010 0609   LDLCALC 87 12/11/2020 0000   LDLCALC 156 (H) 11/27/2015 1132  Physical Exam:    VS:  BP 118/70 (BP Location: Left Arm, Patient Position: Sitting, Cuff Size: Normal)   Pulse (!) 59   Ht 5\' 5"  (1.651 m)   Wt 139 lb 6.4 oz (63.2 kg)   SpO2 99%   BMI 23.20 kg/m     Wt Readings from Last 3 Encounters:  05/09/23 139 lb 6.4 oz (63.2 kg)  11/19/22 129 lb (58.5 kg)  11/16/22 126 lb (57.2 kg)     GEN: Well nourished, well developed in no acute distress HEENT: Normal NECK: No JVD; No carotid bruits LYMPHATICS: No lymphadenopathy CARDIAC: S1S2 noted,RRR, no murmurs, rubs, gallops RESPIRATORY:  Clear to auscultation without rales, wheezing or rhonchi  ABDOMEN: Soft, non-tender, non-distended, +bowel sounds, no guarding. EXTREMITIES: No edema, No cyanosis, no clubbing MUSCULOSKELETAL:  No deformity  SKIN: Warm and dry NEUROLOGIC:  Alert and oriented x 3, non-focal PSYCHIATRIC:  Normal affect, good insight  ASSESSMENT:    No diagnosis found. PLAN:     1.  The patient is in agreement with the above plan. The patient left the office in stable condition.  The patient will follow up in   Medication Adjustments/Labs and Tests Ordered: Current medicines are reviewed at length with the patient today.  Concerns regarding medicines are outlined above.  No orders of the defined types were placed in this encounter.  No  orders of the defined types were placed in this encounter.   There are no Patient Instructions on file for this visit.   Adopting a Healthy Lifestyle.  Know what a healthy weight is for you (roughly BMI <25) and aim to maintain this   Aim for 7+ servings of fruits and vegetables daily   65-80+ fluid ounces of water or unsweet tea for healthy kidneys   Limit to max 1 drink of alcohol per day; avoid smoking/tobacco   Limit animal fats in diet for cholesterol and heart health - choose grass fed whenever available   Avoid highly processed foods, and foods high in saturated/trans fats   Aim for low stress - take time to unwind and care for your mental health   Aim for 150 min of moderate intensity exercise weekly for heart health, and weights twice weekly for bone health   Aim for 7-9 hours of sleep daily   When it comes to diets, agreement about the perfect plan isnt easy to find, even among the experts. Experts at the [facility] [employer] developed an idea known as the Healthy Eating Plate. Just imagine a plate divided into logical, healthy portions.   The emphasis is on diet quality:   Load up on vegetables and fruits - one-half of your plate: Aim for color and variety, and remember that potatoes dont count.   Go for whole grains - one-quarter of your plate: Whole wheat, barley, wheat berries, quinoa, oats, brown rice, and foods made with them. If you want pasta, go with whole wheat pasta.   Protein power - one-quarter of your plate: Fish, chicken, beans, and nuts are all healthy, versatile protein sources. Limit red meat.   The diet, however, does go beyond the plate, offering a few other suggestions.   Use healthy plant oils, such as olive, canola, soy, corn, sunflower and peanut. Check the labels, and avoid partially hydrogenated oil, which have unhealthy trans fats.   If youre thirsty, drink water. Coffee and tea are good in moderation, but skip sugary drinks  and limit milk and dairy products to  one or two daily servings.   The type of carbohydrate in the diet is more important than the amount. Some sources of carbohydrates, such as vegetables, fruits, whole grains, and beans-are healthier than others.   Finally, stay active  Signed, Thomasene Ripple, DO  05/09/2023 2:29 PM    [facility]

## 2023-05-09 NOTE — Patient Instructions (Signed)
Medication Instructions:  Your physician recommends that you continue on your current medications as directed. Please refer to the Current Medication list given to you today.  *If you need a refill on your cardiac medications before your next appointment, please call your pharmacy*   Lab Work: None   Testing/Procedures: None   Follow-Up: At [facility], you and your health needs are our priority.  As part of our continuing mission to provide you with exceptional heart care, we have created designated Provider Care Teams.  These Care Teams include your primary Cardiologist (physician) and Advanced Practice Providers (APPs -  Physician Assistants and Nurse Practitioners) who all work together to provide you with the care you need, when you need it.  Your next appointment:   6 month(s)  Provider:   Kardie Tobb, DO      

## 2023-05-10 ENCOUNTER — Telehealth: Payer: Self-pay | Admitting: Cardiology

## 2023-05-10 NOTE — Telephone Encounter (Signed)
Called pt, she states she needs Dr. Servando Salina to sign off on her to be restarted on her Adderall. "My doctor will not put me back on it until my heart specialist signs off."Will get message to Dr. Servando Salina for review.

## 2023-05-10 NOTE — Telephone Encounter (Signed)
Patient is requesting to speak with RN Leavy Cella. Patient did not state what it was in regards to. Please advise.

## 2023-05-11 ENCOUNTER — Encounter: Payer: Self-pay | Admitting: Cardiology

## 2023-05-11 DIAGNOSIS — I2510 Atherosclerotic heart disease of native coronary artery without angina pectoris: Secondary | ICD-10-CM | POA: Insufficient documentation

## 2023-05-11 DIAGNOSIS — F339 Major depressive disorder, recurrent, unspecified: Secondary | ICD-10-CM | POA: Insufficient documentation

## 2023-05-11 HISTORY — DX: Atherosclerotic heart disease of native coronary artery without angina pectoris: I25.10

## 2023-05-20 DIAGNOSIS — M25571 Pain in right ankle and joints of right foot: Secondary | ICD-10-CM | POA: Insufficient documentation

## 2023-07-01 ENCOUNTER — Telehealth: Payer: Self-pay | Admitting: Cardiology

## 2023-07-01 ENCOUNTER — Other Ambulatory Visit: Payer: Self-pay

## 2023-07-01 ENCOUNTER — Emergency Department (HOSPITAL_COMMUNITY)
Admission: EM | Admit: 2023-07-01 | Discharge: 2023-07-02 | Disposition: A | Discharge status: Home / Self Care | Payer: 59 | Attending: Emergency Medicine | Admitting: Emergency Medicine

## 2023-07-01 ENCOUNTER — Emergency Department (HOSPITAL_COMMUNITY): Payer: 59

## 2023-07-01 ENCOUNTER — Encounter (HOSPITAL_COMMUNITY): Payer: Self-pay

## 2023-07-01 DIAGNOSIS — I2510 Atherosclerotic heart disease of native coronary artery without angina pectoris: Secondary | ICD-10-CM | POA: Insufficient documentation

## 2023-07-01 DIAGNOSIS — R319 Hematuria, unspecified: Secondary | ICD-10-CM | POA: Insufficient documentation

## 2023-07-01 DIAGNOSIS — R42 Dizziness and giddiness: Secondary | ICD-10-CM | POA: Insufficient documentation

## 2023-07-01 DIAGNOSIS — M25552 Pain in left hip: Secondary | ICD-10-CM | POA: Insufficient documentation

## 2023-07-01 DIAGNOSIS — R072 Precordial pain: Secondary | ICD-10-CM | POA: Insufficient documentation

## 2023-07-01 DIAGNOSIS — R0602 Shortness of breath: Secondary | ICD-10-CM | POA: Insufficient documentation

## 2023-07-01 DIAGNOSIS — R079 Chest pain, unspecified: Secondary | ICD-10-CM | POA: Diagnosis present

## 2023-07-01 LAB — CBC
HCT: 41.1 % (ref 36.0–46.0)
Hemoglobin: 13.7 g/dL (ref 12.0–15.0)
MCH: 30.5 pg (ref 26.0–34.0)
MCHC: 33.3 g/dL (ref 30.0–36.0)
MCV: 91.5 fL (ref 80.0–100.0)
Platelets: 156 10*3/uL (ref 150–400)
RBC: 4.49 MIL/uL (ref 3.87–5.11)
RDW: 11.4 % — ABNORMAL LOW (ref 11.5–15.5)
WBC: 6 10*3/uL (ref 4.0–10.5)
nRBC: 0 % (ref 0.0–0.2)

## 2023-07-01 LAB — BASIC METABOLIC PANEL
Anion gap: 9 (ref 5–15)
BUN: 14 mg/dL (ref 6–20)
CO2: 26 mmol/L (ref 22–32)
Calcium: 9.1 mg/dL (ref 8.9–10.3)
Chloride: 106 mmol/L (ref 98–111)
Creatinine, Ser: 0.7 mg/dL (ref 0.44–1.00)
GFR, Estimated: >60 mL/min (ref >60)
Glucose, Bld: 89 mg/dL (ref 70–99)
Potassium: 3.8 mmol/L (ref 3.5–5.1)
Sodium: 141 mmol/L (ref 135–145)

## 2023-07-01 LAB — TROPONIN I (HIGH SENSITIVITY)
Troponin I (High Sensitivity): 4 ng/L (ref <18)
Troponin I (High Sensitivity): 4 ng/L (ref <18)

## 2023-07-01 NOTE — Telephone Encounter (Signed)
Spoke with pt, she reports she has been getting discomfort in her chest and SOB with any exertion. She reports this is similar to the symptoms she had in April prior to stent and the symptoms are getting worse. Advised patient to go to the ER for evaluation. Patient voiced understanding.

## 2023-07-01 NOTE — ED Triage Notes (Signed)
Pt is coming for chest pain, dizziness, and some intermittent shortness of breath that occurred when she was going to take a bath today. She mentions having a recent stent placement in April of this year. She also endorses using x1 0.4mg  SL nitroglycerin today, she called her doctor who sent her here for further evaluation. No nausea, no vomiting is is otherwise stable at this time.

## 2023-07-01 NOTE — ED Notes (Signed)
Pt was called multiple times without response.

## 2023-07-01 NOTE — Telephone Encounter (Signed)
   Pt c/o of Chest Pain: STAT if active CP, including tightness, pressure, jaw pain, radiating pain to shoulder/upper arm/back, CP unrelieved by Nitro. Symptoms reported of SOB, nausea, vomiting, sweating.  1. Are you having CP right now? A little discomfort     2. Are you experiencing any other symptoms (ex. SOB, nausea, vomiting, sweating)? Nausea, shaking really back, sob, felt like she was going to pass out   3. Is your CP continuous or coming and going? Coming and going   4. Have you taken Nitroglycerin? Yes , eased it off a little bit   5. How long have you been experiencing CP? unsure    6. If NO CP at time of call then end call with telling Pt to call back or call 911 if Chest pain returns prior to return call from triage team.

## 2023-07-01 NOTE — ED Notes (Signed)
Pt was called multiple times in the waiting room for vitals with no response.

## 2023-07-01 NOTE — ED Provider Triage Note (Signed)
Emergency Medicine Provider Triage Evaluation Note  Martha [name] , a 56 y.o. female  was evaluated in triage.  Pt complains of chest pain.  Started a couple weeks ago but has been progressively worse in the last couple days. Chest pain is generalized.Was worse today. Took a nitroglycerin tab today with minimal relief. Hard cardiac stent placed in April of this year.   Review of Systems  Positive: See above Negative: See above  Physical Exam  BP (!) 122/93   Pulse (!) 59   Temp 97.7 F (36.5 C)   Resp 18   SpO2 100%  Gen:   Awake, no distress   Resp:  Normal effort  MSK:   Moves extremities without difficulty  Other:    Medical Decision Making  Medically screening exam initiated at 4:17 PM.  Appropriate orders placed.  Martha [name] was informed that the remainder of the evaluation will be completed by another provider, this initial triage assessment does not replace that evaluation, and the importance of remaining in the ED until their evaluation is complete.  Work up started   Martha Eagle, PA-C 07/01/23 1620

## 2023-07-02 ENCOUNTER — Emergency Department (HOSPITAL_COMMUNITY): Payer: 59

## 2023-07-02 DIAGNOSIS — R072 Precordial pain: Secondary | ICD-10-CM | POA: Diagnosis not present

## 2023-07-02 LAB — D-DIMER, QUANTITATIVE: D-Dimer, Quant: 0.33 ug{FEU}/mL (ref 0.00–0.50)

## 2023-07-02 LAB — TROPONIN I (HIGH SENSITIVITY): Troponin I (High Sensitivity): 4 ng/L (ref <18)

## 2023-07-02 LAB — URINALYSIS, ROUTINE W REFLEX MICROSCOPIC
Bacteria, UA: NONE SEEN
Bilirubin Urine: NEGATIVE
Glucose, UA: NEGATIVE mg/dL
Ketones, ur: NEGATIVE mg/dL
Leukocytes,Ua: NEGATIVE
Nitrite: NEGATIVE
Protein, ur: NEGATIVE mg/dL
Specific Gravity, Urine: 1.026 (ref 1.005–1.030)
pH: 5 (ref 5.0–8.0)

## 2023-07-02 MED ORDER — KETOROLAC TROMETHAMINE 30 MG/ML IJ SOLN
30.0000 mg | Freq: Once | INTRAMUSCULAR | Status: AC
  Administered 2023-07-02: 30 mg via INTRAVENOUS
  Filled 2023-07-02: qty 1

## 2023-07-02 NOTE — ED Provider Notes (Signed)
Emergency Department Provider Note   I have reviewed the triage vital signs and the nursing notes.   HISTORY  Chief Complaint Chest Pain   HPI Martha [name] is a 57 y.o. female with past history reviewed below including CAD with stent placement in April presents to the emergency department with return of central chest pressure.  Symptoms have been intermittent over the past 2 days.  She has had more rare pain over the past couple of weeks but symptoms are increasing in severity.  She has had some associated shortness of breath and occasional lightheadedness.  She states that this constellation of symptoms was what she experienced during her last stent placement. No vomiting or abdominal pain. She has been compliant with her home medications.    Past Medical History:  Diagnosis Date   Abdominal pain in female 11/27/2015   ADHD    Anxiety    Arthritis    Body aches 11/27/2015   Coronary artery disease involving native coronary artery of native heart 05/11/2023   Current use of estrogen therapy 11/27/2015   Depression    GERD (gastroesophageal reflux disease)    Heart murmur    Hematuria 11/27/2015   History of depression    History of kidney stones    "must still be there."   Hot flashes due to surgical menopause 11/27/2015   Hyperlipemia 11/27/2015   Hyperlipidemia    Mental disorder    history of depression   Moody 11/27/2015   Nausea 11/27/2015   Neck pain    PTSD (post-traumatic stress disorder)    PTSD (post-traumatic stress disorder)    Vaginal itching 12/11/2015    Review of Systems  Constitutional: No fever/chills Cardiovascular: Positive chest pain. Respiratory: Positive shortness of breath. Gastrointestinal: No abdominal pain.  No nausea, no vomiting.  No diarrhea.  No constipation. Genitourinary: Negative for dysuria. Musculoskeletal: Negative for back pain. Skin: Negative for rash. Neurological: Negative for  headaches.  ____________________________________________   PHYSICAL EXAM:  VITAL SIGNS: ED Triage Vitals  Encounter Vitals Group     BP 07/01/23 1613 (!) 122/93     Pulse Rate 07/01/23 1613 (!) 59     Resp 07/01/23 1613 18     Temp 07/01/23 1613 97.7 F (36.5 C)     Temp Source 07/01/23 2344 Oral     SpO2 07/01/23 1613 100 %     Weight 07/01/23 2343 133 lb (60.3 kg)     Height 07/01/23 2343 5\' 5"  (1.651 m)   Constitutional: Alert and oriented. Well appearing and in no acute distress. Eyes: Conjunctivae are normal.  Head: Atraumatic. Nose: No congestion/rhinnorhea. Mouth/Throat: Mucous membranes are moist.  Neck: No stridor.   Cardiovascular: Normal rate, regular rhythm. Good peripheral circulation. Grossly normal heart sounds.   Respiratory: Normal respiratory effort.  No retractions. Lungs CTAB. Gastrointestinal: Soft and nontender. No distention.  Musculoskeletal: No gross deformities of extremities. Neurologic:  Normal speech and language.  Skin:  Skin is warm, dry and intact. No rash noted.  ____________________________________________   LABS (all labs ordered are listed, but only abnormal results are displayed)  Labs Reviewed  CBC - Abnormal; Notable for the following components:      Result Value   RDW 11.4 (*)    All other components within normal limits  URINALYSIS, ROUTINE W REFLEX MICROSCOPIC - Abnormal; Notable for the following components:   Color, Urine AMBER (*)    APPearance HAZY (*)    Hgb urine dipstick LARGE (*)  All other components within normal limits  BASIC METABOLIC PANEL  D-DIMER, QUANTITATIVE  TROPONIN I (HIGH SENSITIVITY)  TROPONIN I (HIGH SENSITIVITY)  TROPONIN I (HIGH SENSITIVITY)   ____________________________________________  EKG   EKG Interpretation Date/Time:  Friday July 01 2023 16:17:24 EST Ventricular Rate:  54 PR Interval:  156 QRS Duration:  82 QT Interval:  414 QTC Calculation: 392 R Axis:   75  Text  Interpretation: Sinus bradycardia Otherwise normal ECG When compared with ECG of 16-Nov-2022 11:53, PREVIOUS ECG IS PRESENT Confirmed by Alona Bene [phone]) on 07/01/2023 11:34:13 PM        ____________________________________________  RADIOLOGY  CT Renal Stone Study  Result Date: 07/02/2023 CLINICAL DATA:  Left-sided flank pain.  Evaluate for kidney stone. EXAM: CT ABDOMEN AND PELVIS WITHOUT CONTRAST TECHNIQUE: Multidetector CT imaging of the abdomen and pelvis was performed following the standard protocol without IV contrast. RADIATION DOSE REDUCTION: This exam was performed according to the departmental dose-optimization program which includes automated exposure control, adjustment of the mA and/or kV according to patient size and/or use of iterative reconstruction technique. COMPARISON:  04/22/2015 FINDINGS: Lower chest: No acute abnormality. Hepatobiliary: No focal liver abnormality is seen. No gallstones, gallbladder wall thickening, or biliary dilatation. Pancreas: Unremarkable. No pancreatic ductal dilatation or surrounding inflammatory changes. Spleen: Normal in size without focal abnormality. Adrenals/Urinary Tract: Normal adrenal glands. No nephrolithiasis or mass identified. 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 No follow-up imaging recommended. No hydroureter or hydronephrosis identified bilaterally. Urinary bladder appears normal. Stomach/Bowel: Normal appearance of the stomach. Status post proximal colon resection with distal anastomosis. There is no pathologic dilatation of the large or small bowel loops indicating obstruction. No bowel wall thickening or inflammatory changes identified. Vascular/Lymphatic: Aortic atherosclerosis. No signs of abdominopelvic adenopathy. Surgical clips identified along bilateral iliac lymph node chains. Reproductive: Status post hysterectomy.  No adnexal mass. Other: Midline supraumbilical ventral abdominal wall defect measures 3.1 cm in  transverse diameter. The hernia sac which contains fat only measures 5.2 x 1.9 by 5.0 cm, image 30/3 and image 68/7. No free fluid or fluid collections identified. No signs of pneumoperitoneum. Musculoskeletal: Previous posterior hardware fixation and decompression laminectomy at L5-S1 with interbody fusion. No acute or suspicious osseous abnormalities. IMPRESSION: 1. No acute findings within the abdomen or pelvis. 2. No evidence for nephrolithiasis or obstructive uropathy. There are multiple left kidney parapelvic cysts for which no specific follow-up imaging is recommended. 3. Midline supraumbilical ventral abdominal wall defect measures 3.1 cm in transverse diameter. The hernia sac which contains fat only measures 5.2 x 1.9 x 5.0 cm. 4. Status post proximal colon resection with distal anastomosis. 5.  Aortic Atherosclerosis (ICD10-I70.0). Electronically Signed   By: Signa Kell M.D.   On: 07/02/2023 06:19   DG Hip Unilat W or Wo Pelvis 2-3 Views Left  Result Date: 07/02/2023 CLINICAL DATA:  Left hip pain EXAM: DG HIP (WITH OR WITHOUT PELVIS) 2-3V LEFT COMPARISON:  None Available. FINDINGS: There is no evidence of hip fracture or dislocation. There is no evidence of arthropathy or other focal bone abnormality. There surgical changes in the pelvis. Lower lumbar fusion hardware is present. IMPRESSION: Negative. Electronically Signed   By: Darliss Cheney M.D.   On: 07/02/2023 01:00    ____________________________________________   PROCEDURES  Procedure(s) performed:   Procedures  None  ____________________________________________   INITIAL IMPRESSION / ASSESSMENT AND PLAN / ED COURSE  Pertinent labs & imaging results that were available during my care of the patient were reviewed  by me and considered in my medical decision making (see chart for details).   This patient is Presenting for Evaluation of CP, which does require a range of treatment options, and is a complaint that involves a high  risk of morbidity and mortality.  The Differential Diagnoses includes but is not exclusive to acute coronary syndrome, aortic dissection, pulmonary embolism, cardiac tamponade, community-acquired pneumonia, pericarditis, musculoskeletal chest wall pain, etc.   Critical Interventions-    Medications  ketorolac (TORADOL) 30 MG/ML injection 30 mg (30 mg Intravenous Given 07/02/23 0335)    Reassessment after intervention:  pain symptoms improved.    I decided to review pertinent External Data, and in summary patient's last admit from April reviewed.   Clinical Laboratory Tests Ordered, included CBC without leukocytosis. Troponin negative x 2. No AKI.   Radiologic Tests Ordered, included CXR. I independently interpreted the images and agree with radiology interpretation.   Cardiac Monitor Tracing which shows NSR.    Social Determinants of Health Risk patient is a non-smoker.   Consult complete with Cardiology fellow on call. Discussed negative troponin x 3. Plan for close outpatient follow up.    Medical Decision Making: Summary:  Patient presents emergency department for evaluation of chest discomfort becoming increasingly frequent and severe.  Have added on a D-dimer and a third troponin.  Her to priors are negative.   Reevaluation with update and discussion with patient. CP resolved. Troponin negative x 3. And d dimer negative. Patient complaining more of left flank pain at this time. Possible hematuria grossly with bedside urine. Will send for CT renal and send UA to lab as well.   CT renal without acute finding. Plan for PCP, Cards, Ortho, and Urology follow up.   Considered admission but CP workup is largely reassuring. Patient stable for discharge.   Patient's presentation is most consistent with acute presentation with potential threat to life or bodily function.   Disposition: discharge  ____________________________________________  FINAL CLINICAL IMPRESSION(S) / ED  DIAGNOSES  Final diagnoses:  Precordial chest pain  Left hip pain  Hematuria, unspecified type    Note:  This document was prepared using Dragon voice recognition software and may include unintentional dictation errors.  Alona Bene, MD, [facility] Emergency Medicine    Jamayah Myszka, Arlyss Repress, MD 07/02/23 [phone]

## 2023-07-02 NOTE — ED Notes (Signed)
Patient at CT

## 2023-07-02 NOTE — Discharge Instructions (Signed)
You were seen in the emergency department today with chest discomfort.  Your lab work is reassuring but I would like for you to call the cardiology doctors first thing on Monday.  Return immediately if your pain suddenly worsens or changes.  I have listed the name for an orthopedic doctor to follow-up regarding her left hip pain.  They can decide on further imaging and treatment as an outpatient.  Your urine did have some blood in it.  I do not see any sign of infection or kidney stone but if you continue to have dark in the urine you should follow-up with a urologist for further evaluation.  I have listed the contact information from on this form.

## 2023-07-20 ENCOUNTER — Other Ambulatory Visit: Payer: Self-pay | Admitting: Cardiology

## 2023-07-21 DIAGNOSIS — M25552 Pain in left hip: Secondary | ICD-10-CM | POA: Insufficient documentation

## 2023-07-21 DIAGNOSIS — M79674 Pain in right toe(s): Secondary | ICD-10-CM | POA: Insufficient documentation

## 2023-08-17 ENCOUNTER — Ambulatory Visit: Payer: 59 | Admitting: Podiatry

## 2023-08-24 ENCOUNTER — Other Ambulatory Visit (HOSPITAL_COMMUNITY): Payer: Self-pay | Admitting: Family

## 2023-08-24 ENCOUNTER — Encounter (HOSPITAL_COMMUNITY): Payer: Self-pay | Admitting: Family

## 2023-08-24 DIAGNOSIS — N644 Mastodynia: Secondary | ICD-10-CM

## 2023-08-29 ENCOUNTER — Emergency Department (HOSPITAL_COMMUNITY)
Admission: EM | Admit: 2023-08-29 | Discharge: 2023-08-29 | Disposition: A | Discharge status: Home / Self Care | Payer: 59 | Attending: Emergency Medicine | Admitting: Emergency Medicine

## 2023-08-29 ENCOUNTER — Other Ambulatory Visit: Payer: Self-pay

## 2023-08-29 ENCOUNTER — Encounter (HOSPITAL_COMMUNITY): Payer: Self-pay

## 2023-08-29 DIAGNOSIS — I2510 Atherosclerotic heart disease of native coronary artery without angina pectoris: Secondary | ICD-10-CM | POA: Insufficient documentation

## 2023-08-29 DIAGNOSIS — Z7902 Long term (current) use of antithrombotics/antiplatelets: Secondary | ICD-10-CM | POA: Diagnosis not present

## 2023-08-29 DIAGNOSIS — Z7982 Long term (current) use of aspirin: Secondary | ICD-10-CM | POA: Diagnosis not present

## 2023-08-29 DIAGNOSIS — Z91040 Latex allergy status: Secondary | ICD-10-CM | POA: Insufficient documentation

## 2023-08-29 DIAGNOSIS — N644 Mastodynia: Secondary | ICD-10-CM | POA: Diagnosis present

## 2023-08-29 MED ORDER — IBUPROFEN 800 MG PO TABS
800.0000 mg | ORAL_TABLET | Freq: Once | ORAL | Status: AC
  Administered 2023-08-29: 800 mg via ORAL
  Filled 2023-08-29: qty 1

## 2023-08-29 MED ORDER — IBUPROFEN 600 MG PO TABS: 600.0000 mg | ORAL_TABLET | Freq: Three times a day (TID) | ORAL | 0 refills | Status: DC

## 2023-08-29 MED ORDER — HYDROCODONE-ACETAMINOPHEN 5-325 MG PO TABS: 1.0000 | ORAL_TABLET | Freq: Four times a day (QID) | ORAL | 0 refills | Status: DC | PRN

## 2023-08-29 NOTE — ED Triage Notes (Signed)
Pt arrived via POV c/o left breast pain that "knocks my breath out." Pt reports pain has been on-going X 2 weeks and reports OTC medications have not helped.

## 2023-08-29 NOTE — ED Provider Notes (Signed)
[place] EMERGENCY DEPARTMENT AT [facility] Provider Note   CSN: [id number] Arrival date & time: 08/29/23  1237     History  Chief Complaint  Patient presents with   Breast Pain    Martha [name] is a 58 y.o. female with a history including GERD, hyperlipidemia, CAD who was diagnosed with a breast cyst with a prior mammogram and ultrasound, this happened in 2023, she has developed pain and a nodule in her left upper breast which started about 2 weeks ago at the same site of the prior cyst.  She describes tenderness which is worse with palpation and certain movements, sometimes severe pain.  She denies skin changes,  nipple discharge, fevers or chills. She had taken otc meds with no relief.  PCP has scheduled diagnostic mammogram and [place] here, but first available is 2/25 - pt does not feel she can wait that long for this test.   The history is provided by the patient.       Home Medications Prior to Admission medications   Medication Sig Start Date End Date Taking? Authorizing Provider  HYDROcodone-acetaminophen (NORCO/VICODIN) 5-325 MG tablet Take 1 tablet by mouth every 6 (six) hours as needed. 08/29/23  Yes Murrell Dome, Raynelle Fanning, PA-C  ibuprofen (ADVIL) 600 MG tablet Take 1 tablet (600 mg total) by mouth 3 (three) times daily. 08/29/23  Yes Abbrielle Batts, Raynelle Fanning, PA-C  acetaminophen (TYLENOL) 500 MG tablet Take 1,000 mg by mouth every 6 (six) hours as needed for moderate pain.    [provider]  aspirin EC 81 MG tablet Take 1 tablet (81 mg total) by mouth daily. Swallow whole. 02/16/23 02/16/24  Tobb, Kardie, DO  Brimonidine Tartrate (LUMIFY) 0.025 % SOLN Place 1 drop into both eyes daily as needed (redness/irritation).    [provider]  clopidogrel (PLAVIX) 75 MG tablet Take 1 tablet (75 mg total) by mouth daily. 02/16/23 02/16/24  Tobb, Kardie, DO  diazepam (VALIUM) 2 MG tablet Take 2 mg by mouth 2 (two) times daily as needed for anxiety.    [provider]   estradiol (ESTRACE) 1 MG tablet Take 1 tablet (1 mg total) by mouth daily. 06/03/16   Adline Potter, NP  nitroGLYCERIN (NITROSTAT) 0.4 MG SL tablet DISSOLVE 1 TABLET SUBLINGUALLY AS NEEDED FOR CHEST PAIN, MAY REPEAT EVERY 5 MINUTES. AFTER 3 CALL 911. 03/22/23   Tobb, Kardie, DO  oxybutynin (DITROPAN-XL) 5 MG 24 hr tablet Take 5 mg by mouth at bedtime. 09/23/20   [provider]  OZEMPIC, 0.25 OR 0.5 MG/DOSE, 2 MG/3ML SOPN Inject 0.5 mg into the skin every Tuesday. 10/04/22   [provider]  Pseudoeph-Doxylamine-DM-APAP (NYQUIL PO) Take 1 Dose by mouth daily as needed (sleep).    [provider]  rOPINIRole (REQUIP) 2 MG tablet Take 2 mg by mouth at bedtime. 05/01/23   [provider]  rosuvastatin (CRESTOR) 40 MG tablet Take 1 tablet (40 mg total) by mouth daily. 02/16/23   Tobb, Kardie, DO  sertraline (ZOLOFT) 50 MG tablet Take 50 mg by mouth daily. 09/26/20   [provider]      Allergies    Morphine and codeine, Codeine, and Latex    Review of Systems   Review of Systems  Constitutional:  Negative for chills and fever.  HENT:  Negative for congestion.   Eyes: Negative.   Respiratory:  Negative for chest tightness and shortness of breath.   Gastrointestinal:  Negative for abdominal pain and nausea.  Genitourinary: Negative.  Musculoskeletal:  Negative for arthralgias, joint swelling and neck pain.  Skin: Negative.  Negative for rash and wound.  Neurological:  Negative for dizziness, weakness, light-headedness, numbness and headaches.  Psychiatric/Behavioral: Negative.      Physical Exam Updated Vital Signs BP 115/83 (BP Location: Right Arm)   Pulse 63   Temp 98.2 F (36.8 C) (Oral)   Resp 18   Ht 5\' 5"  (1.651 m)   Wt 60 kg   SpO2 99%   BMI 22.01 kg/m  Physical Exam Vitals and nursing note reviewed.  Constitutional:      Appearance: She is well-developed.  HENT:     Head: Normocephalic and atraumatic.  Cardiovascular:      Rate and Rhythm: Normal rate and regular rhythm.     Heart sounds: Normal heart sounds.  Pulmonary:     Effort: Pulmonary effort is normal.     Breath sounds: Normal breath sounds. No wheezing.  Chest:     Chest wall: Mass and tenderness present. No deformity, swelling or edema.     Comments: Tender nodular, linear density left breast 2 oclock position similar to previous documented breast cyst.  No axillary adenopathy.  Skin is normal, no erythema, rash or lesions.  Musculoskeletal:        General: Normal range of motion.     Cervical back: Normal range of motion.  Skin:    General: Skin is warm and dry.  Neurological:     Mental Status: She is alert.     ED Results / Procedures / Treatments   Labs (all labs ordered are listed, but only abnormal results are displayed) Labs Reviewed - No data to display  EKG None  Radiology No results found.  Procedures Procedures    Medications Ordered in ED Medications  ibuprofen (ADVIL) tablet 800 mg (800 mg Oral Given 08/29/23 1435)    ED Course/ Medical Decision Making/ A&P                                 Medical Decision Making Patient presenting with a 2-week history of left breast pain and nodule with known prior cyst at this location.  There is no erythema, induration, no nipple discharge, no symptoms suggesting a breast infection.  She is scheduled for diagnostic mammogram and ultrasound, although with the degree of pain she is and I agree with her need to have more expedient workup of this condition.  I called the mammogram department, her name has been put on a cancellation list, they also stated that they are trying to add an extra day for breast studies since they have been real busy and she will be the first to be called if they are able to add a Thursday.  I also gave patient other recommendations, perhaps the breast center in [place] could see her more expeditiously, this would require her primary MD to change the  order to this facility, patient is aware and agrees with considering that plan.  She was placed on prescription strength ibuprofen, prescribed hydrocodone for severe pain relief, we discussed heat therapy which may also help relieve symptoms if indeed this is a worsening cyst.  Further management pending results of her upcoming test.  Risk Prescription drug management.           Final Clinical Impression(s) / ED Diagnoses Final diagnoses:  Breast pain    Rx / DC Orders ED Discharge Orders  Ordered    ibuprofen (ADVIL) 600 MG tablet  3 times daily        08/29/23 1431    HYDROcodone-acetaminophen (NORCO/VICODIN) 5-325 MG tablet  Every 6 hours PRN        08/29/23 1431              Burgess Amor, PA-C 08/29/23 1701    Pricilla Loveless, MD 09/01/23 [phone]

## 2023-08-29 NOTE — Discharge Instructions (Addendum)
I have added you to the work in list in the event the breast department here has a cancellation or they add an extra day (they are trying to get a Thursday open which can move up your appointment - currently they only do these procedures on Tuesdays at [facility]).  I might also suggest calling the Breast Center in [place] - they do these tests daily and you may be able to get in much quicker with them. Your primary MD would just need to move the order there.   I have prescribed you prescription strength ibuprofen for pain,  hydrocodone if needed for severe pain -use caution with hydrocodone as it will make you drowsy - do not drive within 4 hours of taking hydrocodone. A heating pad applied 20 minutes several times daily may also help with pain, especially if this is the same cyst that was seen at your last diagnostic testing - it is in the same location.

## 2023-09-06 ENCOUNTER — Ambulatory Visit (HOSPITAL_COMMUNITY)
Admission: RE | Admit: 2023-09-06 | Discharge: 2023-09-06 | Disposition: A | Discharge status: Home / Self Care | Payer: 59 | Source: Ambulatory Visit | Attending: Family | Admitting: Family

## 2023-09-06 ENCOUNTER — Ambulatory Visit (HOSPITAL_COMMUNITY)
Admission: RE | Admit: 2023-09-06 | Discharge: 2023-09-06 | Discharge status: Home / Self Care | Payer: 59 | Source: Ambulatory Visit | Attending: Family | Admitting: Family

## 2023-09-06 DIAGNOSIS — N644 Mastodynia: Secondary | ICD-10-CM

## 2023-10-04 ENCOUNTER — Other Ambulatory Visit (HOSPITAL_COMMUNITY): Payer: 59

## 2023-10-04 ENCOUNTER — Encounter (HOSPITAL_COMMUNITY): Payer: 59

## 2023-10-11 ENCOUNTER — Ambulatory Visit: Payer: 59 | Attending: Cardiology | Admitting: Cardiology

## 2023-10-20 ENCOUNTER — Other Ambulatory Visit: Payer: Self-pay | Admitting: Cardiology

## 2024-01-08 ENCOUNTER — Other Ambulatory Visit: Payer: Self-pay | Admitting: Cardiology

## 2024-01-13 ENCOUNTER — Telehealth: Payer: Self-pay | Admitting: Cardiology

## 2024-01-13 NOTE — Telephone Encounter (Signed)
 Pt called and says that she has been having so much bruising on the Plavix  and ASA... she has  been on both since her stent was placed 11/16/2022...  Her Cath note: ASA-Plavix  DAPT for 6 months minimum. Would continue Plavix  monotherapy afterwards.   I will send note to Dr Emmette Harms to review and let us  know if okay at this point since the pt is c/o bruising if she can safely stop the ASA.

## 2024-01-13 NOTE — Telephone Encounter (Signed)
 Patient wants a call back to discuss stent and medication issues.

## 2024-01-17 ENCOUNTER — Other Ambulatory Visit: Payer: Self-pay | Admitting: Cardiology

## 2024-01-18 ENCOUNTER — Encounter: Payer: Self-pay | Admitting: *Deleted

## 2024-01-18 NOTE — Telephone Encounter (Signed)
 Pt returning call due to not hearing back from us . Advised ok to stop per [employer].

## 2024-01-18 NOTE — Telephone Encounter (Signed)
 Verified with patient that she understands that she can stop aspirin . Taken off of med list

## 2024-02-27 ENCOUNTER — Telehealth: Payer: Self-pay | Admitting: Cardiology

## 2024-02-27 NOTE — Telephone Encounter (Signed)
 Pt c/o Shortness Of Breath: STAT if SOB developed within the last 24 hours or pt is noticeably SOB on the phone  1. Are you currently SOB (can you hear that pt is SOB on the phone)?  No   2. How long have you been experiencing SOB?  Past few weeks to 1 month  3. Are you SOB when sitting or when up moving around?  Both, but mainly when up and moving around  4. Are you currently experiencing any other symptoms?  No

## 2024-02-27 NOTE — Telephone Encounter (Signed)
 Pt called to report that she has been having SOB at rest and with exertion but she has been helping her daughter move and she has been having some chest tightness with exertion... I made her an appt with the DOD 02/28/24.

## 2024-02-28 ENCOUNTER — Encounter: Payer: Self-pay | Admitting: Internal Medicine

## 2024-02-28 ENCOUNTER — Ambulatory Visit: Attending: Internal Medicine | Admitting: Internal Medicine

## 2024-02-28 VITALS — BP 120/98 | HR 87 | Ht 65.0 in | Wt 133.2 lb

## 2024-02-28 DIAGNOSIS — I2510 Atherosclerotic heart disease of native coronary artery without angina pectoris: Secondary | ICD-10-CM

## 2024-02-28 DIAGNOSIS — Z955 Presence of coronary angioplasty implant and graft: Secondary | ICD-10-CM

## 2024-02-28 DIAGNOSIS — R0789 Other chest pain: Secondary | ICD-10-CM | POA: Diagnosis not present

## 2024-02-28 DIAGNOSIS — R0609 Other forms of dyspnea: Secondary | ICD-10-CM | POA: Diagnosis not present

## 2024-02-28 NOTE — Progress Notes (Signed)
 OFFICE NOTE  Chief Complaint:  Dyspnea and chest pressure  Primary Care Physician: Carlon Mitzie SAUNDERS, FNP  HPI:  Martha [name] is a 58 y.o. female patient of Dr. Sheena, with a past medial history significant for coronary artery disease status post PCI to the RCA in 2024, hypertension, prediabetes, hyperlipidemia and depression/anxiety/ADHD.  She had an abnormal coronary CT which suggested RCA stenosis in April 2024 and subsequently on catheterization was found to have 85% stenosis.  There was very minimal LAD disease and no significant disease of the circumflex artery at that time.  Her symptoms prior to that were chest pressure and shortness of breath which is similar to what she recently has been describing.  An echocardiogram after catheterization showed normal LV function.  She now presents for a doctor of the day visit.  She has had some progressive shortness of breath and chest pressure.  She was actually seen for similar symptoms back in November 2024 and ruled out for an MI.  Her chest pressure then radiated somewhat to her flank and she underwent a CT scan to rule out renal stone or pyelonephritis which was negative.  The symptoms are primarily when walking up stairs or up hills that she becomes short of breath.  The onset of her symptoms was in the past few weeks.  Of note her blood pressure was elevated today at 126/102 initially however recheck came down to 120/98.  PMHx:  Past Medical History:  Diagnosis Date   Abdominal pain in female 11/27/2015   ADHD    Anxiety    Arthritis    Body aches 11/27/2015   Coronary artery disease involving native coronary artery of native heart 05/11/2023   Current use of estrogen therapy 11/27/2015   Depression    GERD (gastroesophageal reflux disease)    Heart murmur    Hematuria 11/27/2015   History of depression    History of kidney stones    must still be there.   Hot flashes due to surgical menopause 11/27/2015   Hyperlipemia 11/27/2015    Hyperlipidemia    Mental disorder    history of depression   Moody 11/27/2015   Nausea 11/27/2015   Neck pain    PTSD (post-traumatic stress disorder)    PTSD (post-traumatic stress disorder)    Vaginal itching 12/11/2015    Past Surgical History:  Procedure Laterality Date   ABDOMINAL HYSTERECTOMY     APPENDECTOMY     BACK SURGERY     CARPAL TUNNEL RELEASE Right    COLON SURGERY     COLONOSCOPY     CORONARY STENT INTERVENTION N/A 11/16/2022   Procedure: CORONARY STENT INTERVENTION;  Surgeon: Anner Alm ORN, MD;  Location: [facility] INVASIVE CV LAB;  Service: Cardiovascular;  Laterality: N/A;   LEFT HEART CATH AND CORONARY ANGIOGRAPHY N/A 11/16/2022   Procedure: LEFT HEART CATH AND CORONARY ANGIOGRAPHY;  Surgeon: Anner Alm ORN, MD;  Location: [facility] INVASIVE CV LAB;  Service: Cardiovascular;  Laterality: N/A;   LUMBAR FUSION     L5-S1   NECK SURGERY     x 2   TOOTH EXTRACTION N/A 11/14/2020   Procedure: DENTAL RESTORATION/EXTRACTIONS;  Surgeon: Sheryle Hamilton, DMD;  Location: [facility] OR;  Service: Oral Surgery;  Laterality: N/A;   UPPER GASTROINTESTINAL ENDOSCOPY     WRIST SURGERY Left    cyst removal    FAMHx:  Family History  Problem Relation Age of Onset   Bone cancer Mother    Emphysema Father  COPD Sister    Cancer Other    Colon cancer Neg Hx    Pancreatic cancer Neg Hx    Esophageal cancer Neg Hx    Colon polyps Neg Hx     SOCHx:   reports that she quit smoking about 10 years ago. Her smoking use included cigarettes. She started smoking about 35 years ago. She has a 25 pack-year smoking history. She has never used smokeless tobacco. She reports that she does not drink alcohol and does not use drugs.  ALLERGIES:  Allergies  Allergen Reactions   Morphine  And Codeine Shortness Of Breath and Itching    Patient can  tolerate if given Benadryl  with medication,    Codeine Hives   Latex Rash    ROS: Pertinent items noted in HPI and remainder of comprehensive ROS  otherwise negative.  HOME MEDS: Current Outpatient Medications on File Prior to Visit  Medication Sig Dispense Refill   amphetamine-dextroamphetamine (ADDERALL XR) 30 MG 24 hr capsule Take 30 mg by mouth every morning.     amphetamine-dextroamphetamine (ADDERALL) 15 MG tablet Take 1 tablet by mouth daily as needed.     ARIPiprazole (ABILIFY) 10 MG tablet Take 10 mg by mouth daily.     Brimonidine Tartrate (LUMIFY) 0.025 % SOLN Place 1 drop into both eyes daily as needed (redness/irritation).     clopidogrel  (PLAVIX ) 75 MG tablet TAKE ONE TABLET BY MOUTH EVERY DAY 90 tablet 0   diazepam (VALIUM) 2 MG tablet Take 2 mg by mouth 2 (two) times daily as needed for anxiety.     estradiol  (ESTRACE ) 1 MG tablet Take 1 tablet (1 mg total) by mouth daily. 90 tablet 3   ibuprofen  (ADVIL ) 600 MG tablet Take 1 tablet (600 mg total) by mouth 3 (three) times daily. 30 tablet 0   isosorbide  mononitrate (IMDUR ) 30 MG 24 hr tablet Take 30 mg by mouth daily.     nitroGLYCERIN  (NITROSTAT ) 0.4 MG SL tablet DISSOLVE 1 TABLET SUBLINGUALLY AS NEEDED FOR CHEST PAIN, MAY REPEAT EVERY 5 MINUTES. AFTER 3 CALL 911. 25 tablet 0   oxybutynin (DITROPAN-XL) 5 MG 24 hr tablet Take 5 mg by mouth at bedtime.     Pseudoeph-Doxylamine-DM-APAP (NYQUIL PO) Take 1 Dose by mouth daily as needed (sleep).     rOPINIRole (REQUIP) 2 MG tablet Take 2 mg by mouth at bedtime.     rosuvastatin  (CRESTOR ) 40 MG tablet TAKE ONE TABLET BY MOUTH EVERY DAY 90 tablet 0   sertraline (ZOLOFT) 50 MG tablet Take 50 mg by mouth daily.     No current facility-administered medications on file prior to visit.    LABS/IMAGING: No results found for this or any previous visit (from the past 48 hours). No results found.  LIPID PANEL:    Component Value Date/Time   CHOL 196 12/11/2020 0000   CHOL 250 (H) 11/27/2015 1132   TRIG 179 (A) 12/11/2020 0000   HDL 79 (A) 12/11/2020 0000   HDL 78 11/27/2015 1132   CHOLHDL 3.2 11/27/2015 1132   CHOLHDL 4.9  07/07/2010 0609   VLDL 30 07/07/2010 0609   LDLCALC 87 12/11/2020 0000   LDLCALC 156 (H) 11/27/2015 1132     WEIGHTS: Wt Readings from Last 3 Encounters:  02/28/24 133 lb 3.2 oz (60.4 kg)  08/29/23 132 lb 4.4 oz (60 kg)  07/01/23 133 lb (60.3 kg)    VITALS: BP (!) 120/98   Pulse 87   Ht 5' 5 (1.651 m)   Wt  133 lb 3.2 oz (60.4 kg)   SpO2 97%   BMI 22.17 kg/m   EXAM: General appearance: alert and no distress Lungs: clear to auscultation bilaterally Heart: regular rate and rhythm, S1, S2 normal, no murmur, click, rub or gallop Extremities: extremities normal, atraumatic, no cyanosis or edema Neurologic: Grossly normal  EKG: EKG Interpretation Date/Time:  Tuesday February 28 2024 09:48:04 EDT Ventricular Rate:  87 PR Interval:  144 QRS Duration:  80 QT Interval:  352 QTC Calculation: 423 R Axis:   76  Text Interpretation: Normal sinus rhythm Normal ECG When compared with ECG of 01-Jul-2023 16:17, No significant change since last tracing Confirmed by Mona Kent [phone]) on 02/28/2024 10:07:31 AM - personally reviewed  ASSESSMENT: Atypical chest pressure Dyspnea on exertion Coronary artery disease status post PCI to the RCA (2024) Diastolic hypertension  PLAN: 1.   Ms. Pownall has recently had chest pressure and progressive dyspnea on exertion, which feels similar to her symptoms that she had prior to stenting in 2024.  It is unlikely that she has had significant progression or restenosis of her RCA stents and was noted not to have any substantial coronary disease of the left coronary system.  Her symptoms might be related to uncontrolled diastolic hypertension.  She is not on any medications for hypertension currently.  I would recommend a Lexiscan Myoview to rule out ischemia.  It would not be advisable to exercise her given her diastolic hypertension.  If this continues to be an issue then she may benefit from blood pressure medications specifically if her Myoview is  negative.  Follow-up with Dr. Sheena.  Kent KYM Mona, MD, [facility], FNLA, FACP  [place]  [facility]  Medical Director of the Advanced Lipid Disorders &  Cardiovascular Risk Reduction Clinic Diplomate of the American Board of Clinical Lipidology Attending Cardiologist  Direct Dial: [phone]  Fax: [phone]  Website:  [email or web address]   Kent BROCKS Geovanny Sartin 02/28/2024, 1:21 PM

## 2024-02-28 NOTE — Patient Instructions (Signed)
 Medication Instructions:  Your physician recommends that you continue on your current medications as directed. Please refer to the Current Medication list given to you today.  *If you need a refill on your cardiac medications before your next appointment, please call your pharmacy*  Testing/Procedures: How to Prepare for Your Myoview Test (stress test):  Please do not take these medications before your test: n/a (please note if this is an exercise test pt should hold beta blocker prior) Your remaining medications may be taken with water. Nothing to eat or drink, except water, 4 hours prior to arrival time.  NO caffeine/decaffeinated products, or chocolate 12 hours prior to arrival. [place], please do not wear dresses.  Skirts or pants are approprate, please wear a short sleeve shirt. NO perfume, cologne or lotion Wear comfortable walking shoes.  NO HEELS! Total time is 3 to 4 hours; you may want to bring reading material for the waiting time. Please report to [facility] for your test  What to expect after you arrive: Once you arrive and check in for your appointment an IV will be started in your arm.  Then the Technoligist will inject a small amount of radioactive tracer.  There will be a 1 hour waiting period after this injection.  A series of pictures will be taken of your heart following this waiting period.  You will be prepped for the stress portion of the test.  During the stress portion of your test you will either walk on a treadmill or receive a small, safe amount of radioactive tracer injected in your IV.  After the stress portion, there is a short rest period during which time your heart and blood pressure will be monitored.  After the short rest period the Technologist will begin your second set of pictures.  Your doctor will inform you of your test results within 7-10 business days.  In preparation for your appointment, medication and supplies will be purchased.  Appointment  availability is limited, so if you need to cancel or reschedule please call the office at [phone] 24 hours in advance to avoid a cancellation fee of $100.00  IF YOU THINK YOU MAY BE PREGNANT, PLEASE INFORM THE TECHNOLOGIST.   Follow-Up: At [facility], you and your health needs are our priority.  As part of our continuing mission to provide you with exceptional heart care, our providers are all part of one team.  This team includes your primary Cardiologist (physician) and Advanced Practice Providers or APPs (Physician Assistants and Nurse Practitioners) who all work together to provide you with the care you need, when you need it.  Your next appointment:   F/u with Dr. Sheena after scan

## 2024-03-01 ENCOUNTER — Other Ambulatory Visit: Payer: Self-pay

## 2024-03-01 MED ORDER — NITROGLYCERIN 0.4 MG SL SUBL: 0.4000 mg | SUBLINGUAL_TABLET | SUBLINGUAL | 0 refills | Status: AC | PRN

## 2024-03-01 MED ORDER — ISOSORBIDE MONONITRATE ER 30 MG PO TB24: 30.0000 mg | ORAL_TABLET | Freq: Every day | ORAL | 0 refills | Status: DC

## 2024-03-06 ENCOUNTER — Telehealth (HOSPITAL_COMMUNITY): Payer: Self-pay | Admitting: *Deleted

## 2024-03-06 ENCOUNTER — Encounter (HOSPITAL_COMMUNITY): Payer: Self-pay | Admitting: *Deleted

## 2024-03-06 NOTE — Telephone Encounter (Signed)
 Upcoming stress test instructions sent via [employer]. Claudene Ronal Quale, RN

## 2024-03-12 ENCOUNTER — Telehealth (HOSPITAL_COMMUNITY): Payer: Self-pay

## 2024-03-12 ENCOUNTER — Other Ambulatory Visit: Payer: Self-pay | Admitting: Internal Medicine

## 2024-03-12 DIAGNOSIS — R079 Chest pain, unspecified: Secondary | ICD-10-CM

## 2024-03-12 DIAGNOSIS — R0789 Other chest pain: Secondary | ICD-10-CM

## 2024-03-12 NOTE — Telephone Encounter (Signed)
 The patient had questions about her test. S.Orhan Mayorga CCT

## 2024-03-14 ENCOUNTER — Ambulatory Visit (HOSPITAL_COMMUNITY)
Admission: RE | Admit: 2024-03-14 | Discharge: 2024-03-14 | Disposition: A | Discharge status: Home / Self Care | Source: Ambulatory Visit | Attending: Cardiovascular Disease | Admitting: Cardiovascular Disease

## 2024-03-14 DIAGNOSIS — R079 Chest pain, unspecified: Secondary | ICD-10-CM | POA: Diagnosis present

## 2024-03-14 DIAGNOSIS — R0789 Other chest pain: Secondary | ICD-10-CM | POA: Diagnosis not present

## 2024-03-14 LAB — MYOCARDIAL PERFUSION IMAGING
LV dias vol: 88 mL (ref 46–106)
LV sys vol: 40 mL (ref 3.8–5.2)
Nuc Stress EF: 55 %
Peak HR: 85 {beats}/min
Rest HR: 72 {beats}/min
Rest Nuclear Isotope Dose: 10.6 mCi
SDS: 1
SRS: 4
SSS: 3
ST Depression (mm): 0 mm
Stress Nuclear Isotope Dose: 30.7 mCi
TID: 0.98

## 2024-03-14 MED ORDER — TECHNETIUM TC 99M TETROFOSMIN IV KIT
10.6000 | PACK | Freq: Once | INTRAVENOUS | Status: AC | PRN
  Administered 2024-03-14: 10.6 via INTRAVENOUS

## 2024-03-14 MED ORDER — REGADENOSON 0.4 MG/5ML IV SOLN
INTRAVENOUS | Status: AC
  Filled 2024-03-14: qty 5

## 2024-03-14 MED ORDER — REGADENOSON 0.4 MG/5ML IV SOLN
0.4000 mg | Freq: Once | INTRAVENOUS | Status: AC
  Administered 2024-03-14: 0.4 mg via INTRAVENOUS

## 2024-03-14 MED ORDER — TECHNETIUM TC 99M TETROFOSMIN IV KIT
30.7000 | PACK | Freq: Once | INTRAVENOUS | Status: AC | PRN
  Administered 2024-03-14: 30.7 via INTRAVENOUS

## 2024-03-15 ENCOUNTER — Ambulatory Visit: Payer: Self-pay | Admitting: Internal Medicine

## 2024-03-22 ENCOUNTER — Ambulatory Visit: Payer: Self-pay | Admitting: Internal Medicine

## 2024-03-22 DIAGNOSIS — I2510 Atherosclerotic heart disease of native coronary artery without angina pectoris: Secondary | ICD-10-CM

## 2024-04-12 ENCOUNTER — Other Ambulatory Visit: Payer: Self-pay | Admitting: Cardiology

## 2024-04-12 MED ORDER — ROSUVASTATIN CALCIUM 40 MG PO TABS: 40.0000 mg | ORAL_TABLET | Freq: Every day | ORAL | 0 refills | Status: DC

## 2024-04-13 ENCOUNTER — Other Ambulatory Visit: Payer: Self-pay | Admitting: Cardiology

## 2024-04-13 MED ORDER — CLOPIDOGREL BISULFATE 75 MG PO TABS: 75.0000 mg | ORAL_TABLET | Freq: Every day | ORAL | 3 refills | Status: AC

## 2024-05-22 ENCOUNTER — Ambulatory Visit: Attending: Cardiology | Admitting: Cardiology

## 2024-05-22 ENCOUNTER — Ambulatory Visit: Admitting: Cardiology

## 2024-05-22 ENCOUNTER — Encounter: Payer: Self-pay | Admitting: Cardiology

## 2024-05-22 VITALS — BP 110/82 | HR 69 | Ht 65.0 in | Wt 141.2 lb

## 2024-05-22 DIAGNOSIS — I2510 Atherosclerotic heart disease of native coronary artery without angina pectoris: Secondary | ICD-10-CM | POA: Diagnosis not present

## 2024-05-22 DIAGNOSIS — Z79899 Other long term (current) drug therapy: Secondary | ICD-10-CM

## 2024-05-22 DIAGNOSIS — E782 Mixed hyperlipidemia: Secondary | ICD-10-CM | POA: Diagnosis not present

## 2024-05-22 NOTE — Progress Notes (Signed)
 Cardiology Office Note:    Date:  05/22/2024   ID:  Martha [name], DOB [birth year], MRN [id number]  PCP:  Jama Chow, MD  Cardiologist:  Dub Huntsman, DO  Electrophysiologist:  None   Referring MD: Carlon Mitzie SAUNDERS, FNP    I am just feeling tired  History of Present Illness:    Martha [name] is a 58 y.o. female with a history of coronary artery disease, hypertension, prediabetes, hyperlipidemia, and GERD.  She is here today for follow-up visit.  Since I last saw the patient she was seen in our office she did see Dr. Mona she was experiencing chest discomfort.  Stress test was performed this was reported to be normal overall low risk.  She is here today for follow-up visit.  Today she has no specific questions.  She has no specific complaints.  She shares with me that is her daughter's birthday (she lost her daughter couple of years ago)  Past Medical History:  Diagnosis Date   Abdominal pain in female 11/27/2015   ADHD    Anxiety    Arthritis    Body aches 11/27/2015   Coronary artery disease involving native coronary artery of native heart 05/11/2023   Current use of estrogen therapy 11/27/2015   Depression    GERD (gastroesophageal reflux disease)    Heart murmur    Hematuria 11/27/2015   History of depression    History of kidney stones    must still be there.   Hot flashes due to surgical menopause 11/27/2015   Hyperlipemia 11/27/2015   Hyperlipidemia    Mental disorder    history of depression   Moody 11/27/2015   Nausea 11/27/2015   Neck pain    PTSD (post-traumatic stress disorder)    PTSD (post-traumatic stress disorder)    Vaginal itching 12/11/2015    Past Surgical History:  Procedure Laterality Date   ABDOMINAL HYSTERECTOMY     APPENDECTOMY     BACK SURGERY     CARPAL TUNNEL RELEASE Right    COLON SURGERY     COLONOSCOPY     CORONARY STENT INTERVENTION N/A 11/16/2022   Procedure: CORONARY STENT INTERVENTION;  Surgeon: Anner Alm ORN, MD;   Location: [facility] INVASIVE CV LAB;  Service: Cardiovascular;  Laterality: N/A;   LEFT HEART CATH AND CORONARY ANGIOGRAPHY N/A 11/16/2022   Procedure: LEFT HEART CATH AND CORONARY ANGIOGRAPHY;  Surgeon: Anner Alm ORN, MD;  Location: [facility] INVASIVE CV LAB;  Service: Cardiovascular;  Laterality: N/A;   LUMBAR FUSION     L5-S1   NECK SURGERY     x 2   TOOTH EXTRACTION N/A 11/14/2020   Procedure: DENTAL RESTORATION/EXTRACTIONS;  Surgeon: Sheryle Hamilton, DMD;  Location: [facility] OR;  Service: Oral Surgery;  Laterality: N/A;   UPPER GASTROINTESTINAL ENDOSCOPY     WRIST SURGERY Left    cyst removal    Current Medications: Current Meds  Medication Sig   amphetamine-dextroamphetamine (ADDERALL XR) 30 MG 24 hr capsule Take 30 mg by mouth every morning.   amphetamine-dextroamphetamine (ADDERALL) 15 MG tablet Take 1 tablet by mouth daily as needed.   ARIPiprazole (ABILIFY) 10 MG tablet Take 10 mg by mouth daily.   Brimonidine Tartrate (LUMIFY) 0.025 % SOLN Place 1 drop into both eyes daily as needed (redness/irritation).   clopidogrel  (PLAVIX ) 75 MG tablet Take 1 tablet (75 mg total) by mouth daily.   diazepam (VALIUM) 2 MG tablet Take 2 mg by mouth 2 (two) times daily as needed for anxiety.  estradiol  (ESTRACE ) 1 MG tablet Take 1 tablet (1 mg total) by mouth daily.   ibuprofen  (ADVIL ) 600 MG tablet Take 1 tablet (600 mg total) by mouth 3 (three) times daily.   isosorbide  mononitrate (IMDUR ) 30 MG 24 hr tablet Take 1 tablet (30 mg total) by mouth daily.   nitroGLYCERIN  (NITROSTAT ) 0.4 MG SL tablet Place 1 tablet (0.4 mg total) under the tongue every 5 (five) minutes as needed for chest pain.   oxybutynin (DITROPAN-XL) 5 MG 24 hr tablet Take 5 mg by mouth at bedtime.   Pseudoeph-Doxylamine-DM-APAP (NYQUIL PO) Take 1 Dose by mouth daily as needed (sleep).   rOPINIRole (REQUIP) 2 MG tablet Take 2 mg by mouth at bedtime.   rosuvastatin  (CRESTOR ) 40 MG tablet Take 1 tablet (40 mg total) by mouth daily.   sertraline  (ZOLOFT) 50 MG tablet Take 50 mg by mouth daily.     Allergies:   Morphine  and codeine, Codeine, and Latex   Social History   Socioeconomic History   Marital status: Legally Separated    Spouse name: Not on file   Number of children: 2   Years of education: Not on file   Highest education level: Not on file  Occupational History   Occupation: disabled  Tobacco Use   Smoking status: Former    Current packs/day: 0.00    Average packs/day: 1 pack/day for 25.0 years (25.0 ttl pk-yrs)    Types: Cigarettes    Start date: 03/11/1988    Quit date: 03/11/2013    Years since quitting: 11.2   Smokeless tobacco: Never   Tobacco comments:    vapes  Vaping Use   Vaping status: Former   Quit date: 11/26/2020  Substance and Sexual Activity   Alcohol use: No   Drug use: No   Sexual activity: Not Currently    Birth control/protection: Surgical    Comment: hyst  Other Topics Concern   Not on file  Social History Narrative   Not on file   Social Drivers of Health   Financial Resource Strain: Not on file  Food Insecurity: Not on file  Transportation Needs: Not on file  Physical Activity: Not on file  Stress: Not on file  Social Connections: Not on file     Family History: The patient's family history includes Bone cancer in her mother; COPD in her sister; Cancer in an other family member; Emphysema in her father. There is no history of Colon cancer, Pancreatic cancer, Esophageal cancer, or Colon polyps.  ROS:   Review of Systems  Constitution: Negative for decreased appetite, fever and weight gain.  HENT: Negative for congestion, ear discharge, hoarse voice and sore throat.   Eyes: Negative for discharge, redness, vision loss in right eye and visual halos.  Cardiovascular: Negative for chest pain, dyspnea on exertion, leg swelling, orthopnea and palpitations.  Respiratory: Negative for cough, hemoptysis, shortness of breath and snoring.   Endocrine: Negative for heat intolerance and  polyphagia.  Hematologic/Lymphatic: Negative for bleeding problem. Does not bruise/bleed easily.  Skin: Negative for flushing, nail changes, rash and suspicious lesions.  Musculoskeletal: Negative for arthritis, joint pain, muscle cramps, myalgias, neck pain and stiffness.  Gastrointestinal: Negative for abdominal pain, bowel incontinence, diarrhea and excessive appetite.  Genitourinary: Negative for decreased libido, genital sores and incomplete emptying.  Neurological: Negative for brief paralysis, focal weakness, headaches and loss of balance.  Psychiatric/Behavioral: Negative for altered mental status, depression and suicidal ideas.  Allergic/Immunologic: Negative for HIV exposure and persistent infections.  EKGs/Labs/Other Studies Reviewed:    The following studies were reviewed today:   EKG:  The ekg ordered today demonstrates   Recent Labs: 07/01/2023: BUN 14; Creatinine, Ser 0.70; Hemoglobin 13.7; Platelets 156; Potassium 3.8; Sodium 141  Recent Lipid Panel    Component Value Date/Time   CHOL 196 12/11/2020 0000   CHOL 250 (H) 11/27/2015 1132   TRIG 179 (A) 12/11/2020 0000   HDL 79 (A) 12/11/2020 0000   HDL 78 11/27/2015 1132   CHOLHDL 3.2 11/27/2015 1132   CHOLHDL 4.9 07/07/2010 0609   VLDL 30 07/07/2010 0609   LDLCALC 87 12/11/2020 0000   LDLCALC 156 (H) 11/27/2015 1132    Physical Exam:    VS:  BP 110/82 (BP Location: Right Arm, Patient Position: Sitting, Cuff Size: Normal)   Pulse 69   Ht 5' 5 (1.651 m)   Wt 141 lb 3.2 oz (64 kg)   SpO2 97%   BMI 23.50 kg/m     Wt Readings from Last 3 Encounters:  05/22/24 141 lb 3.2 oz (64 kg)  03/14/24 133 lb (60.3 kg)  02/28/24 133 lb 3.2 oz (60.4 kg)     GEN: Well nourished, well developed in no acute distress HEENT: Normal NECK: No JVD; No carotid bruits LYMPHATICS: No lymphadenopathy CARDIAC: S1S2 noted,RRR, no murmurs, rubs, gallops RESPIRATORY:  Clear to auscultation without rales, wheezing or rhonchi   ABDOMEN: Soft, non-tender, non-distended, +bowel sounds, no guarding. EXTREMITIES: No edema, No cyanosis, no clubbing MUSCULOSKELETAL:  No deformity  SKIN: Warm and dry NEUROLOGIC:  Alert and oriented x 3, non-focal PSYCHIATRIC:  Normal affect, good insight  ASSESSMENT:    1. Coronary artery disease involving native coronary artery of native heart without angina pectoris   2. Mixed hyperlipidemia     PLAN:    Coronary Artery Disease-continue Plavix  75 mg daily, she is also on rosuvastatin  as well as Imdur .  She is doing well stable recent stress test did not show any abnormalities.  Hyperlipidemia - continue with current statin medication.  Will repeat lipid panel LDL goal is less than 55   The patient is in agreement with the above plan. The patient left the office in stable condition.  The patient will follow up in 1 year or sooner if needed.   Medication Adjustments/Labs and Tests Ordered: Current medicines are reviewed at length with the patient today.  Concerns regarding medicines are outlined above.  No orders of the defined types were placed in this encounter.  No orders of the defined types were placed in this encounter.   There are no Patient Instructions on file for this visit.   Adopting a Healthy Lifestyle.  Know what a healthy weight is for you (roughly BMI <25) and aim to maintain this   Aim for 7+ servings of fruits and vegetables daily   65-80+ fluid ounces of water or unsweet tea for healthy kidneys   Limit to max 1 drink of alcohol per day; avoid smoking/tobacco   Limit animal fats in diet for cholesterol and heart health - choose grass fed whenever available   Avoid highly processed foods, and foods high in saturated/trans fats   Aim for low stress - take time to unwind and care for your mental health   Aim for 150 min of moderate intensity exercise weekly for heart health, and weights twice weekly for bone health   Aim for 7-9 hours of sleep  daily   When it comes to diets, agreement about the perfect plan  isnt easy to find, even among the experts. Experts at the [facility] [employer] developed an idea known as the Healthy Eating Plate. Just imagine a plate divided into logical, healthy portions.   The emphasis is on diet quality:   Load up on vegetables and fruits - one-half of your plate: Aim for color and variety, and remember that potatoes dont count.   Go for whole grains - one-quarter of your plate: Whole wheat, barley, wheat berries, quinoa, oats, brown rice, and foods made with them. If you want pasta, go with whole wheat pasta.   Protein power - one-quarter of your plate: Fish, chicken, beans, and nuts are all healthy, versatile protein sources. Limit red meat.   The diet, however, does go beyond the plate, offering a few other suggestions.   Use healthy plant oils, such as olive, canola, soy, corn, sunflower and peanut. Check the labels, and avoid partially hydrogenated oil, which have unhealthy trans fats.   If youre thirsty, drink water. Coffee and tea are good in moderation, but skip sugary drinks and limit milk and dairy products to one or two daily servings.   The type of carbohydrate in the diet is more important than the amount. Some sources of carbohydrates, such as vegetables, fruits, whole grains, and beans-are healthier than others.   Finally, stay active  Signed, Dub Huntsman, DO  05/22/2024 8:37 AM    [facility]

## 2024-05-22 NOTE — Addendum Note (Signed)
 Addended by: MELIDA ROLIN HERO on: 05/22/2024 08:39 AM   Modules accepted: Orders

## 2024-05-22 NOTE — Patient Instructions (Signed)
 Medication Instructions:  Your physician recommends that you continue on your current medications as directed. Please refer to the Current Medication list given to you today.  *If you need a refill on your cardiac medications before your next appointment, please call your pharmacy*  Lab Work: CMET, Mag, Lipids If you have labs (blood work) drawn today and your tests are completely normal, you will receive your results only by: MyChart Message (if you have MyChart) OR A paper copy in the mail If you have any lab test that is abnormal or we need to change your treatment, we will call you to review the results.   Follow-Up: At [facility], you and your health needs are our priority.  As part of our continuing mission to provide you with exceptional heart care, our providers are all part of one team.  This team includes your primary Cardiologist (physician) and Advanced Practice Providers or APPs (Physician Assistants and Nurse Practitioners) who all work together to provide you with the care you need, when you need it.  Your next appointment:   1 year(s)  Provider:   Kardie Tobb, DO

## 2024-05-23 LAB — LIPID PANEL
Chol/HDL Ratio: 2 ratio (ref 0.0–4.4)
Cholesterol, Total: 156 mg/dL (ref 100–199)
HDL: 78 mg/dL (ref >39)
LDL Chol Calc (NIH): 62 mg/dL (ref 0–99)
Triglycerides: 88 mg/dL (ref 0–149)
VLDL Cholesterol Cal: 16 mg/dL (ref 5–40)

## 2024-05-23 LAB — COMPREHENSIVE METABOLIC PANEL WITH GFR
ALT: 12 IU/L (ref 0–32)
AST: 17 IU/L (ref 0–40)
Albumin: 4.2 g/dL (ref 3.8–4.9)
Alkaline Phosphatase: 66 IU/L (ref 49–135)
BUN/Creatinine Ratio: 11 (ref 9–23)
BUN: 8 mg/dL (ref 6–24)
Bilirubin Total: <0.2 mg/dL (ref 0.0–1.2)
CO2: 25 mmol/L (ref 20–29)
Calcium: 8.9 mg/dL (ref 8.7–10.2)
Chloride: 101 mmol/L (ref 96–106)
Creatinine, Ser: 0.71 mg/dL (ref 0.57–1.00)
Globulin, Total: 2.1 g/dL (ref 1.5–4.5)
Glucose: 107 mg/dL — ABNORMAL HIGH (ref 70–99)
Potassium: 3.9 mmol/L (ref 3.5–5.2)
Sodium: 140 mmol/L (ref 134–144)
Total Protein: 6.3 g/dL (ref 6.0–8.5)
eGFR: 98 mL/min/1.73 (ref >59)

## 2024-05-23 LAB — MAGNESIUM: Magnesium: 1.8 mg/dL (ref 1.6–2.3)

## 2024-05-29 ENCOUNTER — Ambulatory Visit: Payer: Self-pay | Admitting: Cardiology

## 2024-06-05 ENCOUNTER — Telehealth: Payer: Self-pay | Admitting: Cardiology

## 2024-06-05 MED ORDER — ROSUVASTATIN CALCIUM 40 MG PO TABS: 40.0000 mg | ORAL_TABLET | Freq: Every day | ORAL | 3 refills | Status: AC

## 2024-06-05 NOTE — Telephone Encounter (Signed)
*  STAT* If patient is at the pharmacy, call can be transferred to refill team.   1. Which medications need to be refilled? (please list name of each medication and dose if known)   rosuvastatin  (CRESTOR ) 40 MG tablet   2. Would you like to learn more about the convenience, safety, & potential cost savings by using the [facility]?   3. Are you open to using the [facility] (Type [facility]. ).  4. Which pharmacy/location (including street and city if local pharmacy) is medication to be sent to?  [facility] - [place], [place] - [address]   5. Do they need a 30 day or 90 day supply?  90 day  Patient stated she has been out of medication since last Wednesday.

## 2024-06-05 NOTE — Telephone Encounter (Signed)
 Pt's medication was sent to pt's pharmacy as requested. Confirmation received.

## 2024-06-07 ENCOUNTER — Other Ambulatory Visit: Payer: Self-pay

## 2024-06-08 MED ORDER — ISOSORBIDE MONONITRATE ER 30 MG PO TB24: 30.0000 mg | ORAL_TABLET | Freq: Every day | ORAL | 3 refills | Status: AC

## 2024-08-20 ENCOUNTER — Ambulatory Visit: Payer: Self-pay

## 2024-08-20 NOTE — Telephone Encounter (Signed)
 FYI Only or Action Required?: FYI only for provider: appointment scheduled on 1/27 NP.  Patient was last seen in primary care on NP appt.  Called Nurse Triage reporting Diarrhea and Abdominal Pain.  Symptoms began several weeks ago.  Interventions attempted: Rest, hydration, or home remedies.  Symptoms are: gradually worsening.  Triage Disposition: See HCP Within 4 Hours (Or PCP Triage)  Patient/caregiver understands and will follow disposition?: Yes  Copied from CRM #[id number]. Topic: Clinical - Medical Advice >> Aug 20, 2024 12:15 PM Tiffany B wrote: Reason for CRM: Medication managment since colon surgery her bowels have been irritable. Loose bowels for couple weeks and moderate abdominal pain >> Aug 20, 2024 12:21 PM Tiffany B wrote: Patient has a NPA scheduled for 01/02/2025 with [facility]. Patient is unable to follow up with her previous provider due to her insurance changing. Patient seeking medical advice regarding what she can do in the interim.   Reason for Disposition  [1] MILD-MODERATE pain AND [2] constant AND [3] present > 2 hours  Answer Assessment - Initial Assessment Questions Pt with GI surgery 22 years ago. Entire Large intestine and 20% of small intestine removed.   Abdominal pain and diarrhea for a few weeks. Pain is intermittent- unable to describe as its not present currently. Slow build until pain reached about 8/10. Lower abdomen- sometimes refers to the left side. Denies tenderness to touch.  Diarrhea present with pain. Watery loose stools. Has not taken anything for it. Nausea daily. No emesis  Patient needing to establish with new PCP as insurance changed. Willing to go to a little bit further office to be seen sooner than May. Appt found for [place] office end of the month and placed on waitlist.  Advised ED/UC to start work up/ rule out gallbladder/pancreas or complication with remaining bowel.       1. LOCATION: Where does it hurt?      Lower abdomen-  down below belly button all the way across 2. RADIATION: Does the pain shoot anywhere else? (e.g., chest, back)     Left side- gallbladder stomach area 3. ONSET: When did the pain begin? (e.g., minutes, hours or days ago)      Couple of weeks ago 4. SUDDEN: Gradual or sudden onset?     Slowly increases  5. PATTERN Does the pain come and go, or is it constant?     Coming and going  6. SEVERITY: How bad is the pain?  (e.g., Scale 1-10; mild, moderate, or severe)     8/10 at worst- doesn't feel sharp 7. RECURRENT SYMPTOM: Have you ever had this type of stomach pain before? If Yes, ask: When was the last time? and What happened that time?      Over the last month 8. CAUSE: What do you think is causing the stomach pain? (e.g., gallstones, recent abdominal surgery)     GI surgery 22 years ago  9. RELIEVING/AGGRAVATING FACTORS: What makes it better or worse? (e.g., antacids, bending or twisting motion, bowel movement)     Denies  10. OTHER SYMPTOMS: Do you have any other symptoms? (e.g., back pain, diarrhea, fever, urination pain, vomiting)       diarrhea  Answer Assessment - Initial Assessment Questions Present with abdominal pain  1. DIARRHEA SEVERITY: How bad is the diarrhea? How many more stools have you had in the past 24 hours than normal?      Having them happen at night and not even know it- incontinent  2. ONSET:  When did the diarrhea begin?      A couple weeks ago  3. STOOL DESCRIPTION:  How loose or watery is the diarrhea? What is the stool color? Is there any blood or mucous in the stool?     Watery  4. VOMITING: Are you also vomiting? If Yes, ask: How many times in the past 24 hours?      Denies vomiting but nausea a lot  5. ABDOMEN PAIN: Are you having any abdomen pain? If Yes, ask: What does it feel like? (e.g., crampy, dull, intermittent, constant)      Unsure what it feels like  6. ABDOMEN PAIN SEVERITY: If present, ask: How bad is  the pain?  (e.g., Scale 1-10; mild, moderate, or severe)     Pain max 8/10 7. ORAL INTAKE: If vomiting, Have you been able to drink liquids? How much liquids have you had in the past 24 hours?     Appetite not as good- still drinking enough.  8. HYDRATION: Any signs of dehydration? (e.g., dry mouth [not just dry lips], too weak to stand, dizziness, new weight loss) When did you last urinate?     Denies dehydration 9. EXPOSURE: Have you traveled to a foreign country recently? Have you been exposed to anyone with diarrhea? Could you have eaten any food that was spoiled?     Denies  10. ANTIBIOTIC USE: Are you taking antibiotics now or have you taken antibiotics in the past 2 months?       denies 11. OTHER SYMPTOMS: Do you have any other symptoms? (e.g., fever, blood in stool)       Denies  Protocols used: Abdominal Pain - Female-A-AH, Diarrhea-A-AH

## 2024-08-31 ENCOUNTER — Encounter: Payer: Self-pay | Admitting: Student in an Organized Health Care Education/Training Program

## 2024-08-31 ENCOUNTER — Ambulatory Visit (INDEPENDENT_AMBULATORY_CARE_PROVIDER_SITE_OTHER): Admitting: Student in an Organized Health Care Education/Training Program

## 2024-08-31 VITALS — BP 125/86 | HR 78 | Temp 98.2°F | Ht 65.0 in | Wt 135.0 lb

## 2024-08-31 DIAGNOSIS — F1290 Cannabis use, unspecified, uncomplicated: Secondary | ICD-10-CM | POA: Diagnosis not present

## 2024-08-31 DIAGNOSIS — R0609 Other forms of dyspnea: Secondary | ICD-10-CM | POA: Insufficient documentation

## 2024-08-31 DIAGNOSIS — R41840 Attention and concentration deficit: Secondary | ICD-10-CM | POA: Diagnosis not present

## 2024-08-31 DIAGNOSIS — N3946 Mixed incontinence: Secondary | ICD-10-CM | POA: Diagnosis not present

## 2024-08-31 DIAGNOSIS — F3341 Major depressive disorder, recurrent, in partial remission: Secondary | ICD-10-CM | POA: Diagnosis not present

## 2024-08-31 DIAGNOSIS — F988 Other specified behavioral and emotional disorders with onset usually occurring in childhood and adolescence: Secondary | ICD-10-CM | POA: Insufficient documentation

## 2024-08-31 MED ORDER — ARIPIPRAZOLE 10 MG PO TABS: 10.0000 mg | ORAL_TABLET | Freq: Every day | ORAL | 2 refills | Status: AC

## 2024-08-31 MED ORDER — SERTRALINE HCL 50 MG PO TABS: 50.0000 mg | ORAL_TABLET | Freq: Every day | ORAL | 3 refills | Status: AC

## 2024-08-31 NOTE — Assessment & Plan Note (Signed)
 Chronic issue of attention deficit.  Historically was using Adderall XR 30 mg in the morning and then Adderall IR 15 mg around lunch.  Has been out of the XR formulation for about 3 weeks, is currently using only the IR once daily.  Reports still being functional.  I declined to refill the Adderall because of her ongoing marijuana use.  We talked about the options of discontinuing marijuana and seeing if her attention problems resolve.  Patient finds marijuana to be very helpful for her issues of chronic pain and mood.

## 2024-08-31 NOTE — Patient Instructions (Signed)
" °  VISIT SUMMARY: During your visit, we discussed your ongoing bladder issues and shortness of breath. We also reviewed your history of high cholesterol, colectomy, mood issues, and attention deficit disorder. We talked about your current medications and their effectiveness.  YOUR PLAN: -MIXED URINARY INCONTINENCE: Mixed urinary incontinence involves both urge and stress incontinence, leading to frequent urination and lack of control. Since oxybutynin is not effective, you are referred to a gynecologist for further evaluation and management of your bladder prolapse and incontinence. We also discussed the potential use of Mirabegron to help relax your bladder.  -MOOD DISORDER: A mood disorder affects your emotional state and can cause periods of depression or elevated mood. Your mood is currently managed with sertraline  and aripiprazole , and you should continue taking these medications for mood stabilization.  -ATTENTION DEFICIT DISORDER: Attention deficit disorder affects your ability to focus and concentrate. You have been managing this with amphetamine salts, but we discussed tapering off the medication since you are functioning adequately without it. Continue taking amphetamine salts 15 mg once daily as needed for focus, and monitor your functional status. If your focus becomes impaired, we may consider restarting the medication.  INSTRUCTIONS: Please follow up with the gynecologist for your bladder issues. Continue taking your current medications as prescribed. Monitor your focus and functional status, and let us  know if you experience any changes.    Contains text generated by [employer].   "

## 2024-08-31 NOTE — Assessment & Plan Note (Signed)
 Progressive issue of dyspnea on exertion in this person who has disability because of a couple chronic issues including low back pain and neck pain.  She quit tobacco use about 14 years ago but continues to vape and use marijuana inhaled products.  I think she is at least moderate risk for developing COPD.  Will do a pulmonary function test.  No history of anemia as recently as November.

## 2024-08-31 NOTE — Assessment & Plan Note (Signed)
 Symptoms are most consistent with several years of mixed urge and stress incontinence in a person with a history of hysterectomy and 2 vaginal deliveries.  Trial of oxybutynin for several years not been very helpful.  She is tried lifestyle modifications including timed voiding.  Will refer to gynecology for consideration of sling versus pessary versus pelvic floor training.  I think other medications like Myrbetriq are likely not to be very helpful if there is a large component of stress incontinence.

## 2024-08-31 NOTE — Assessment & Plan Note (Signed)
 Chronic issue, currently pretty well-controlled on a regimen of sertraline  50 mg and Abilify  10 mg daily.  Denies seeing a psychiatrist in the past.  Reports a questionable history of bipolar disease in the past, 1 psychiatric admission about 20 years ago.  No metabolic disorder from the Abilify , has been on it for a few years now.  Seems to be helping her.  No active hallucinations or suicidal thoughts.

## 2024-08-31 NOTE — Assessment & Plan Note (Signed)
 Chronic issue.  Patient finds marijuana products to be very helpful.  Uses vape products as well as some CBD products.  Not interested in quitting.

## 2024-08-31 NOTE — Progress Notes (Signed)
 "  New Patient Office Visit  Patient ID: Martha [name], Female   DOB: 1966/02/23 59 y.o. MRN: [id number]  Chief Complaint  Patient presents with   New Patient (Initial Visit)    Patient wants to discuss shortness of breath.    Subjective:     Martha [name] presents to establish care  HPI  Discussed the use of AI scribe software for clinical note transcription with the patient, who gave verbal consent to proceed.  History of Present Illness Martha [name] is a 59 year old female who presents with bladder issues and shortness of breath.  She experiences ongoing bladder issues characterized by urgency and incontinence, describing it as a lack of control. Despite using oxybutynin for overactive bladder, her symptoms persist. She frequently urinates and has been informed of a bladder prolapse.  She experiences shortness of breath, particularly during activities like showering, and notes it occurs even without exertion. No history of asthma or COPD. She quit smoking 14 years ago after a 20-30 year history. She underwent heart catheterization and stent placement in April 2024 due to a blockage but continues to experience symptoms.  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 She reports improved bowel function with dietary changes, having 1-2 bowel movements per day.  She is on disability due to back and neck issues, arthritis, and has had multiple surgeries including a hysterectomy and neck surgery. She lives alone but has a partner who works out of town and family support nearby. She has been on disability for 22 years since her colectomy.  She takes several medications including amphetamine salts for attention issues, Plavix  for her stent, aripiprazole and sertraline for mood, rosuvastatin  for cholesterol, Zofran  for nausea, and estradiol  for menopause symptoms. She has  been using a lower dose of amphetamine salts recently due to lack of a prescribing doctor and reports difficulty focusing without it.  She has a history of mood issues and was hospitalized over 20 years ago. No current suicidal thoughts or hallucinations. She uses marijuana, particularly since her daughter's passing, to help with pain and mood.  She has experienced significant personal loss, including the unexplained death of her 48 year old daughter nearly three years ago, which has impacted her mood and mental health.   Outpatient Encounter Medications as of 08/31/2024  Medication Sig   Brimonidine Tartrate (LUMIFY) 0.025 % SOLN Place 1 drop into both eyes daily as needed (redness/irritation).   clopidogrel  (PLAVIX ) 75 MG tablet Take 1 tablet (75 mg total) by mouth daily.   estradiol  (ESTRACE ) 1 MG tablet Take 1 tablet (1 mg total) by mouth daily.   isosorbide  mononitrate (IMDUR ) 30 MG 24 hr tablet Take 1 tablet (30 mg total) by mouth daily.   nitroGLYCERIN  (NITROSTAT ) 0.4 MG SL tablet Place 1 tablet (0.4 mg total) under the tongue every 5 (five) minutes as needed for chest pain.   oxybutynin (DITROPAN-XL) 5 MG 24 hr tablet Take 5 mg by mouth at bedtime.   rOPINIRole (REQUIP) 2 MG tablet Take 2 mg by mouth at bedtime.   rosuvastatin  (CRESTOR ) 40 MG tablet Take 1 tablet (40 mg total) by mouth daily.   [DISCONTINUED] amphetamine-dextroamphetamine (ADDERALL XR) 30 MG 24 hr capsule Take 30 mg by mouth every morning.   [DISCONTINUED] amphetamine-dextroamphetamine (ADDERALL) 15 MG tablet Take 1 tablet by mouth daily as needed.   [DISCONTINUED] ARIPiprazole (ABILIFY)  10 MG tablet Take 10 mg by mouth daily.   [DISCONTINUED] diazepam (VALIUM) 2 MG tablet Take 2 mg by mouth 2 (two) times daily as needed for anxiety.   [DISCONTINUED] ibuprofen  (ADVIL ) 600 MG tablet Take 1 tablet (600 mg total) by mouth 3 (three) times daily.   [DISCONTINUED] Pseudoeph-Doxylamine-DM-APAP (NYQUIL PO) Take 1 Dose by mouth  daily as needed (sleep).   [DISCONTINUED] sertraline (ZOLOFT) 50 MG tablet Take 50 mg by mouth daily.   ARIPiprazole (ABILIFY) 10 MG tablet Take 1 tablet (10 mg total) by mouth daily.   sertraline (ZOLOFT) 50 MG tablet Take 1 tablet (50 mg total) by mouth daily.   No facility-administered encounter medications on file as of 08/31/2024.    Past Medical History:  Diagnosis Date   Abdominal pain in female 11/27/2015   ADHD    Anxiety    Arthritis    Body aches 11/27/2015   Coronary artery disease involving native coronary artery of native heart 05/11/2023   Current use of estrogen therapy 11/27/2015   Depression    GERD (gastroesophageal reflux disease)    Heart murmur    Hematuria 11/27/2015   History of depression    History of kidney stones    must still be there.   Hot flashes due to surgical menopause 11/27/2015   Hyperlipemia 11/27/2015   Hyperlipidemia    Mental disorder    history of depression   Moody 11/27/2015   Nausea 11/27/2015   Neck pain    PTSD (post-traumatic stress disorder)    PTSD (post-traumatic stress disorder)    Vaginal itching 12/11/2015    Past Surgical History:  Procedure Laterality Date   ABDOMINAL HYSTERECTOMY     APPENDECTOMY     BACK SURGERY     CARPAL TUNNEL RELEASE Right    COLON SURGERY     COLONOSCOPY     CORONARY STENT INTERVENTION N/A 11/16/2022   Procedure: CORONARY STENT INTERVENTION;  Surgeon: Anner Alm ORN, MD;  Location: [facility] INVASIVE CV LAB;  Service: Cardiovascular;  Laterality: N/A;   LEFT HEART CATH AND CORONARY ANGIOGRAPHY N/A 11/16/2022   Procedure: LEFT HEART CATH AND CORONARY ANGIOGRAPHY;  Surgeon: Anner Alm ORN, MD;  Location: [facility] INVASIVE CV LAB;  Service: Cardiovascular;  Laterality: N/A;   LUMBAR FUSION     L5-S1   NECK SURGERY     x 2   TOOTH EXTRACTION N/A 11/14/2020   Procedure: DENTAL RESTORATION/EXTRACTIONS;  Surgeon: Sheryle Hamilton, DMD;  Location: [facility] OR;  Service: Oral Surgery;  Laterality: N/A;   UPPER  GASTROINTESTINAL ENDOSCOPY     WRIST SURGERY Left    cyst removal    Family History  Problem Relation Age of Onset   Bone cancer Mother    Emphysema Father    COPD Sister    Cancer Other    Colon cancer Neg Hx    Pancreatic cancer Neg Hx    Esophageal cancer Neg Hx    Colon polyps Neg Hx       Objective:    BP 125/86   Pulse 78   Temp 98.2 F (36.8 C) (Oral)   Ht 5' 5 (1.651 m)   Wt 135 lb (61.2 kg)   SpO2 94%   BMI 22.47 kg/m   Physical Exam  Gen: Well-appearing woman Ears: Normal tympanic membranes bilaterally Mouth: Upper dentures, no oral lesions Neck: Normal thyroid , no nodules or adenopathy Heart: Regular, no murmur Lungs: Unlabored, clear throughout Abd: Soft, nontender, no organomegaly Ext: Warm, no  edema Skin: Heavily tanned, no atypical macules Neuro: Alert, conversational, full strength upper and lower extremities      Assessment & Plan:   Problem List Items Addressed This Visit       High   Recurrent major depressive disorder (Chronic)   Chronic issue, currently pretty well-controlled on a regimen of sertraline 50 mg and Abilify 10 mg daily.  Denies seeing a psychiatrist in the past.  Reports a questionable history of bipolar disease in the past, 1 psychiatric admission about 20 years ago.  No metabolic disorder from the Abilify, has been on it for a few years now.  Seems to be helping her.  No active hallucinations or suicidal thoughts.      Relevant Medications   ARIPiprazole (ABILIFY) 10 MG tablet   sertraline (ZOLOFT) 50 MG tablet     Medium    ADD (attention deficit disorder) (Chronic)   Chronic issue of attention deficit.  Historically was using Adderall XR 30 mg in the morning and then Adderall IR 15 mg around lunch.  Has been out of the XR formulation for about 3 weeks, is currently using only the IR once daily.  Reports still being functional.  I declined to refill the Adderall because of her ongoing marijuana use.  We talked about  the options of discontinuing marijuana and seeing if her attention problems resolve.  Patient finds marijuana to be very helpful for her issues of chronic pain and mood.      Mixed incontinence urge and stress (Chronic)   Symptoms are most consistent with several years of mixed urge and stress incontinence in a person with a history of hysterectomy and 2 vaginal deliveries.  Trial of oxybutynin for several years not been very helpful.  She is tried lifestyle modifications including timed voiding.  Will refer to gynecology for consideration of sling versus pessary versus pelvic floor training.  I think other medications like Myrbetriq are likely not to be very helpful if there is a large component of stress incontinence.      Relevant Orders   Ambulatory referral to Gynecology   Dyspnea on exertion - Primary (Chronic)   Progressive issue of dyspnea on exertion in this person who has disability because of a couple chronic issues including low back pain and neck pain.  She quit tobacco use about 14 years ago but continues to vape and use marijuana inhaled products.  I think she is at least moderate risk for developing COPD.  Will do a pulmonary function test.  No history of anemia as recently as November.      Relevant Orders   Pulmonary function test     Low   Marijuana use (Chronic)   Chronic issue.  Patient finds marijuana products to be very helpful.  Uses vape products as well as some CBD products.  Not interested in quitting.       Return in about 2 months (around 10/29/2024).   Cleatus Debby Specking, MD [facility]     "

## 2024-09-04 ENCOUNTER — Ambulatory Visit: Admitting: Student in an Organized Health Care Education/Training Program

## 2024-10-29 ENCOUNTER — Ambulatory Visit: Admitting: Student in an Organized Health Care Education/Training Program

## 2024-11-23 ENCOUNTER — Ambulatory Visit: Admitting: Obstetrics and Gynecology

## 2025-01-02 ENCOUNTER — Ambulatory Visit: Payer: Self-pay
